# Patient Record
Sex: Male | Born: 1960 | Hispanic: No | Marital: Married | Smoking: Never smoker
Health system: Southern US, Community
[De-identification: ages and names within clinical notes are randomized; demographics above are authoritative.]

## PROBLEM LIST (undated history)

## (undated) DIAGNOSIS — I10 Essential (primary) hypertension: Secondary | ICD-10-CM

## (undated) DIAGNOSIS — E119 Type 2 diabetes mellitus without complications: Secondary | ICD-10-CM

## (undated) DIAGNOSIS — E785 Hyperlipidemia, unspecified: Secondary | ICD-10-CM

## (undated) HISTORY — DX: Hyperlipidemia, unspecified: E78.5

## (undated) HISTORY — DX: Essential (primary) hypertension: I10

## (undated) HISTORY — DX: Type 2 diabetes mellitus without complications: E11.9

## (undated) HISTORY — PX: COLONOSCOPY: SHX174

---

## 1996-02-27 HISTORY — PX: FINGER SURGERY: SHX640

## 1998-04-06 ENCOUNTER — Ambulatory Visit (HOSPITAL_BASED_OUTPATIENT_CLINIC_OR_DEPARTMENT_OTHER): Admission: RE | Admit: 1998-04-06 | Discharge: 1998-04-06 | Payer: Self-pay | Admitting: Orthopedic Surgery

## 2002-01-07 DIAGNOSIS — E113299 Type 2 diabetes mellitus with mild nonproliferative diabetic retinopathy without macular edema, unspecified eye: Secondary | ICD-10-CM

## 2003-11-25 ENCOUNTER — Ambulatory Visit: Payer: Self-pay | Admitting: Family Medicine

## 2003-12-03 ENCOUNTER — Ambulatory Visit: Payer: Self-pay | Admitting: *Deleted

## 2003-12-23 ENCOUNTER — Ambulatory Visit: Payer: Self-pay | Admitting: Internal Medicine

## 2004-03-16 ENCOUNTER — Ambulatory Visit: Payer: Self-pay | Admitting: Family Medicine

## 2004-09-06 ENCOUNTER — Ambulatory Visit: Payer: Self-pay | Admitting: Internal Medicine

## 2005-01-11 ENCOUNTER — Ambulatory Visit: Payer: Self-pay | Admitting: Internal Medicine

## 2005-08-24 ENCOUNTER — Ambulatory Visit: Payer: Self-pay | Admitting: Internal Medicine

## 2005-10-26 ENCOUNTER — Ambulatory Visit: Payer: Self-pay | Admitting: Internal Medicine

## 2006-04-05 ENCOUNTER — Ambulatory Visit: Payer: Self-pay | Admitting: Internal Medicine

## 2006-04-08 ENCOUNTER — Ambulatory Visit (HOSPITAL_COMMUNITY): Admission: RE | Admit: 2006-04-08 | Discharge: 2006-04-08 | Payer: Self-pay | Admitting: Family Medicine

## 2006-04-08 ENCOUNTER — Ambulatory Visit: Payer: Self-pay | Admitting: Internal Medicine

## 2006-07-31 ENCOUNTER — Ambulatory Visit: Payer: Self-pay | Admitting: Internal Medicine

## 2006-11-13 ENCOUNTER — Encounter (INDEPENDENT_AMBULATORY_CARE_PROVIDER_SITE_OTHER): Payer: Self-pay | Admitting: *Deleted

## 2006-12-16 ENCOUNTER — Telehealth (INDEPENDENT_AMBULATORY_CARE_PROVIDER_SITE_OTHER): Payer: Self-pay | Admitting: *Deleted

## 2007-01-08 DIAGNOSIS — I10 Essential (primary) hypertension: Secondary | ICD-10-CM

## 2007-01-09 ENCOUNTER — Ambulatory Visit: Payer: Self-pay | Admitting: Internal Medicine

## 2007-01-09 DIAGNOSIS — E7800 Pure hypercholesterolemia, unspecified: Secondary | ICD-10-CM | POA: Insufficient documentation

## 2007-01-09 DIAGNOSIS — L219 Seborrheic dermatitis, unspecified: Secondary | ICD-10-CM

## 2007-01-09 LAB — CONVERTED CEMR LAB: Hgb A1c MFr Bld: 7 %

## 2007-01-12 ENCOUNTER — Encounter (INDEPENDENT_AMBULATORY_CARE_PROVIDER_SITE_OTHER): Payer: Self-pay | Admitting: Internal Medicine

## 2007-01-12 LAB — CONVERTED CEMR LAB
ALT: 22 units/L (ref 0–53)
AST: 14 units/L (ref 0–37)
Albumin: 4.9 g/dL (ref 3.5–5.2)
Calcium: 9.6 mg/dL (ref 8.4–10.5)
Cholesterol: 155 mg/dL (ref 0–200)
HDL: 37 mg/dL — ABNORMAL LOW (ref >39)
PSA: 0.59 ng/mL (ref 0.10–4.00)
Potassium: 3.8 meq/L (ref 3.5–5.3)
Total CHOL/HDL Ratio: 4.2
Triglycerides: 179 mg/dL — ABNORMAL HIGH (ref <150)
VLDL: 36 mg/dL (ref 0–40)

## 2007-01-13 ENCOUNTER — Encounter (INDEPENDENT_AMBULATORY_CARE_PROVIDER_SITE_OTHER): Payer: Self-pay | Admitting: Internal Medicine

## 2007-06-18 ENCOUNTER — Ambulatory Visit: Payer: Self-pay | Admitting: Internal Medicine

## 2007-06-19 ENCOUNTER — Encounter (INDEPENDENT_AMBULATORY_CARE_PROVIDER_SITE_OTHER): Payer: Self-pay | Admitting: Internal Medicine

## 2007-07-08 ENCOUNTER — Telehealth (INDEPENDENT_AMBULATORY_CARE_PROVIDER_SITE_OTHER): Payer: Self-pay | Admitting: Internal Medicine

## 2007-07-14 LAB — CONVERTED CEMR LAB
ALT: 14 units/L (ref 0–53)
AST: 15 units/L (ref 0–37)
Albumin: 5 g/dL (ref 3.5–5.2)
Alkaline Phosphatase: 42 units/L (ref 39–117)
Basophils Absolute: 0 10*3/uL (ref 0.0–0.1)
Basophils Relative: 1 % (ref 0–1)
CO2: 27 meq/L (ref 19–32)
Calcium: 10 mg/dL (ref 8.4–10.5)
Chloride: 102 meq/L (ref 96–112)
Cholesterol: 155 mg/dL (ref 0–200)
Eosinophils Relative: 1 % (ref 0–5)
HCT: 45.2 % (ref 39.0–52.0)
LDL Cholesterol: 101 mg/dL — ABNORMAL HIGH (ref 0–99)
Platelets: 248 10*3/uL (ref 150–400)
RDW: 13.6 % (ref 11.5–15.5)
Sodium: 145 meq/L (ref 135–145)
Total CHOL/HDL Ratio: 3.8
Total Protein: 8.2 g/dL (ref 6.0–8.3)
Triglycerides: 63 mg/dL (ref <150)
WBC: 6.6 10*3/uL (ref 4.0–10.5)

## 2007-08-28 ENCOUNTER — Ambulatory Visit: Payer: Self-pay | Admitting: Internal Medicine

## 2007-08-28 DIAGNOSIS — M25569 Pain in unspecified knee: Secondary | ICD-10-CM

## 2007-08-28 LAB — CONVERTED CEMR LAB: Blood Glucose, Fingerstick: 139

## 2007-10-14 ENCOUNTER — Ambulatory Visit: Payer: Self-pay | Admitting: Internal Medicine

## 2007-10-14 LAB — CONVERTED CEMR LAB
ALT: 14 units/L (ref 0–53)
AST: 13 units/L (ref 0–37)
Albumin: 4.8 g/dL (ref 3.5–5.2)
Blood Glucose, Fingerstick: 114
CO2: 28 meq/L (ref 19–32)
Cholesterol: 133 mg/dL (ref 0–200)
Creatinine, Ser: 1.02 mg/dL (ref 0.40–1.50)
Glucose, Bld: 90 mg/dL (ref 70–99)
Total Bilirubin: 0.7 mg/dL (ref 0.3–1.2)
Total Protein: 7.8 g/dL (ref 6.0–8.3)
Triglycerides: 105 mg/dL (ref <150)
VLDL: 21 mg/dL (ref 0–40)

## 2007-10-17 ENCOUNTER — Ambulatory Visit: Payer: Self-pay | Admitting: Internal Medicine

## 2007-10-18 ENCOUNTER — Encounter (INDEPENDENT_AMBULATORY_CARE_PROVIDER_SITE_OTHER): Payer: Self-pay | Admitting: Internal Medicine

## 2007-10-20 ENCOUNTER — Encounter (INDEPENDENT_AMBULATORY_CARE_PROVIDER_SITE_OTHER): Payer: Self-pay | Admitting: Internal Medicine

## 2007-10-30 ENCOUNTER — Encounter (INDEPENDENT_AMBULATORY_CARE_PROVIDER_SITE_OTHER): Payer: Self-pay | Admitting: Internal Medicine

## 2008-02-05 IMAGING — CR DG CHEST 2V
2 series · 2 of 2 positions shown · non-contrast
Comparison: None.

CLINICAL DATA: Cough, fever.
 CHEST - 2 VIEW:

[view not recorded (1 of 2)]
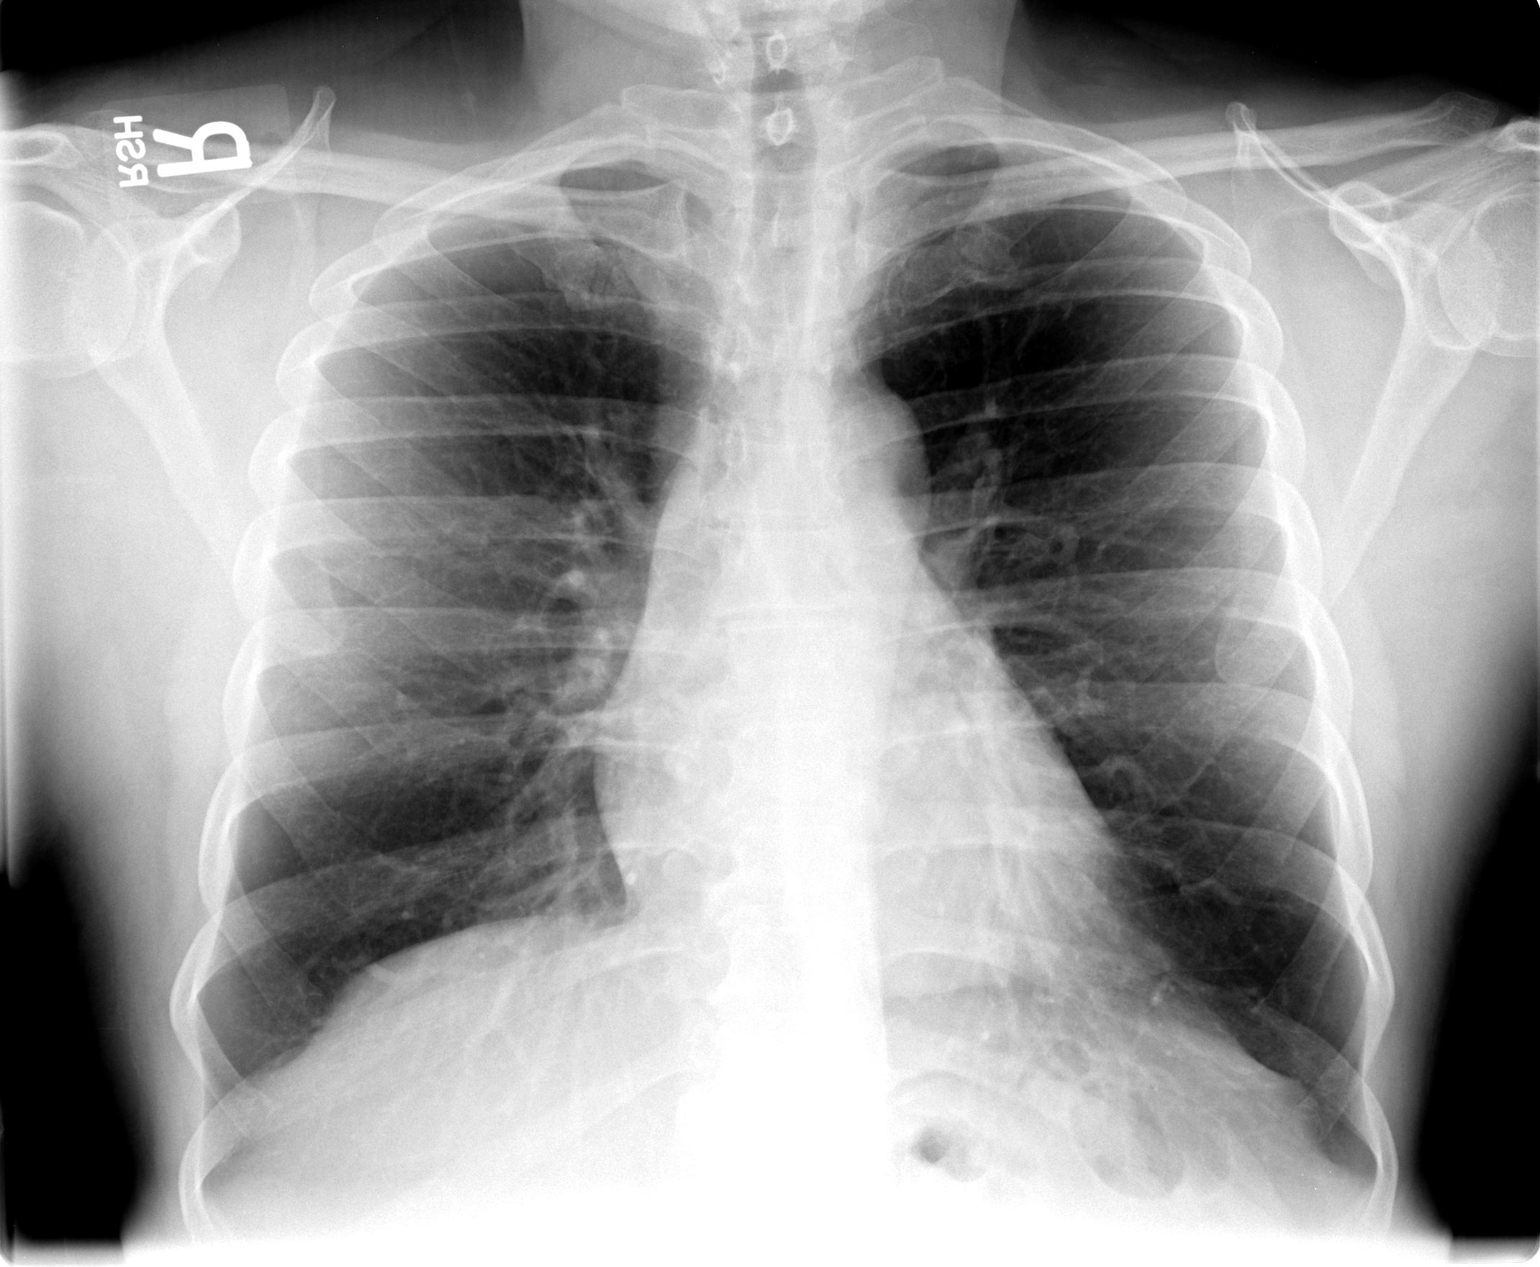

[view not recorded (2 of 2)]
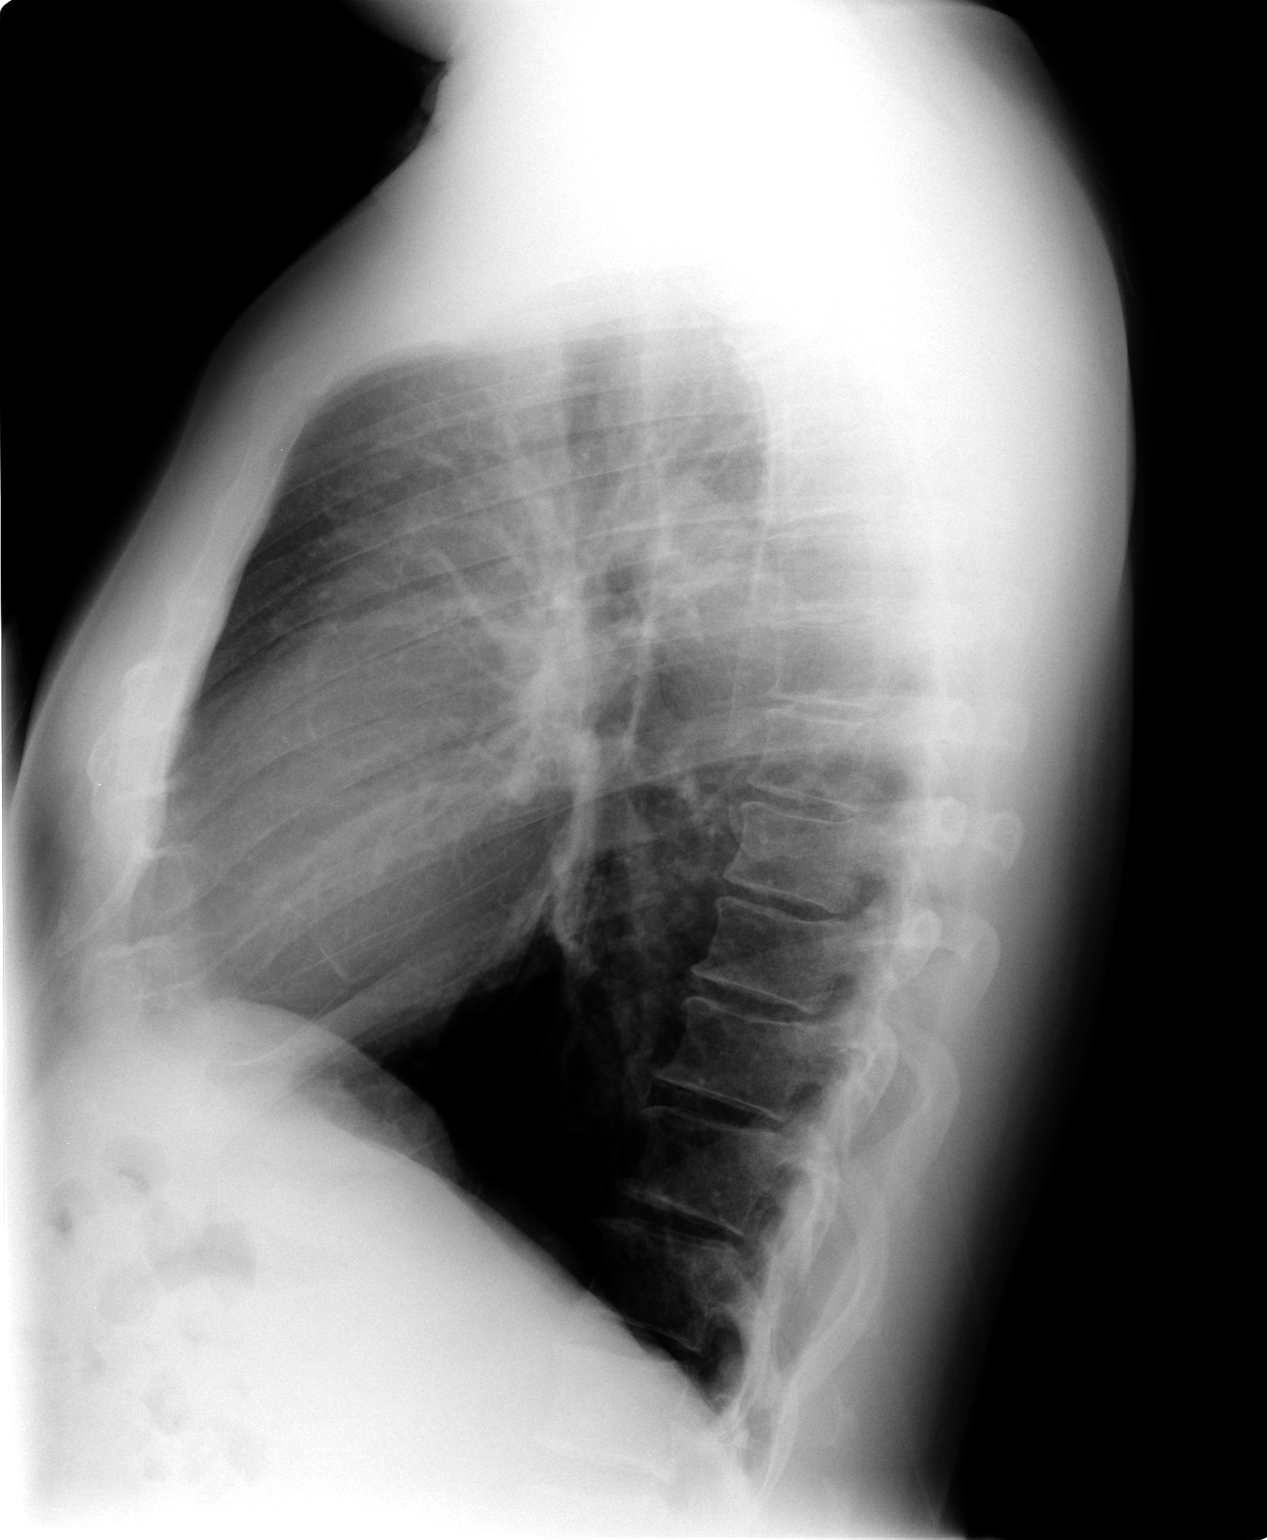

[2 of 2 positions shown; findings below may reference images not displayed]

FINDINGS: The heart size and mediastinal contours are within normal limits.  Both lungs are clear.  The visualized skeletal structures are unremarkable.
IMPRESSION: No active cardiopulmonary disease.

## 2008-04-01 ENCOUNTER — Telehealth (INDEPENDENT_AMBULATORY_CARE_PROVIDER_SITE_OTHER): Payer: Self-pay | Admitting: Internal Medicine

## 2008-05-06 ENCOUNTER — Ambulatory Visit: Payer: Self-pay | Admitting: Internal Medicine

## 2008-05-07 ENCOUNTER — Encounter (INDEPENDENT_AMBULATORY_CARE_PROVIDER_SITE_OTHER): Payer: Self-pay | Admitting: *Deleted

## 2008-05-16 LAB — CONVERTED CEMR LAB
Albumin: 4.9 g/dL (ref 3.5–5.2)
BUN: 8 mg/dL (ref 6–23)
CO2: 26 meq/L (ref 19–32)
Calcium: 9.8 mg/dL (ref 8.4–10.5)
Glucose, Bld: 90 mg/dL (ref 70–99)
Potassium: 3.7 meq/L (ref 3.5–5.3)
Sodium: 141 meq/L (ref 135–145)
Triglycerides: 113 mg/dL (ref <150)
VLDL: 23 mg/dL (ref 0–40)

## 2008-07-23 ENCOUNTER — Ambulatory Visit: Payer: Self-pay | Admitting: Internal Medicine

## 2008-07-23 LAB — CONVERTED CEMR LAB
ALT: 15 units/L (ref 0–53)
AST: 13 units/L (ref 0–37)
Albumin: 4.5 g/dL (ref 3.5–5.2)
Alkaline Phosphatase: 35 units/L — ABNORMAL LOW (ref 39–117)
Cholesterol: 116 mg/dL (ref 0–200)
HDL: 32 mg/dL — ABNORMAL LOW (ref >39)
Indirect Bilirubin: 0.5 mg/dL (ref 0.0–0.9)
Total CHOL/HDL Ratio: 3.6
Total Protein: 7.2 g/dL (ref 6.0–8.3)
Triglycerides: 87 mg/dL (ref <150)
VLDL: 17 mg/dL (ref 0–40)

## 2008-07-30 ENCOUNTER — Encounter (INDEPENDENT_AMBULATORY_CARE_PROVIDER_SITE_OTHER): Payer: Self-pay | Admitting: Internal Medicine

## 2008-09-10 ENCOUNTER — Ambulatory Visit: Payer: Self-pay | Admitting: Internal Medicine

## 2008-09-10 LAB — CONVERTED CEMR LAB
Bilirubin Urine: NEGATIVE
Blood Glucose, Fingerstick: 187
Eosinophils Absolute: 0.1 10*3/uL (ref 0.0–0.7)
HCT: 42.9 % (ref 39.0–52.0)
Ketones, urine, test strip: NEGATIVE
Lymphocytes Relative: 51 % — ABNORMAL HIGH (ref 12–46)
Monocytes Absolute: 0.3 10*3/uL (ref 0.1–1.0)
Nitrite: NEGATIVE
PSA: 0.49 ng/mL (ref 0.10–4.00)
Platelets: 231 10*3/uL (ref 150–400)
Protein, U semiquant: NEGATIVE
RBC: 4.77 M/uL (ref 4.22–5.81)
RDW: 13.5 % (ref 11.5–15.5)
Specific Gravity, Urine: 1.015
pH: 6.5

## 2008-09-25 ENCOUNTER — Encounter (INDEPENDENT_AMBULATORY_CARE_PROVIDER_SITE_OTHER): Payer: Self-pay | Admitting: Internal Medicine

## 2009-01-14 ENCOUNTER — Telehealth (INDEPENDENT_AMBULATORY_CARE_PROVIDER_SITE_OTHER): Payer: Self-pay | Admitting: Internal Medicine

## 2009-02-07 ENCOUNTER — Ambulatory Visit: Payer: Self-pay | Admitting: Internal Medicine

## 2009-02-07 LAB — CONVERTED CEMR LAB
AST: 12 units/L (ref 0–37)
Albumin: 4.7 g/dL (ref 3.5–5.2)
Bilirubin, Direct: 0.1 mg/dL (ref 0.0–0.3)
Indirect Bilirubin: 0.5 mg/dL (ref 0.0–0.9)
Total CHOL/HDL Ratio: 3.4
Total Protein: 7.4 g/dL (ref 6.0–8.3)
Triglycerides: 84 mg/dL (ref <150)

## 2009-02-23 ENCOUNTER — Ambulatory Visit: Payer: Self-pay | Admitting: Internal Medicine

## 2009-02-23 LAB — CONVERTED CEMR LAB
Blood Glucose, Fingerstick: 111
Hgb A1c MFr Bld: 6.8 % — ABNORMAL HIGH (ref 4.6–6.1)

## 2009-03-21 ENCOUNTER — Ambulatory Visit: Payer: Self-pay | Admitting: Family Medicine

## 2009-03-21 ENCOUNTER — Encounter (INDEPENDENT_AMBULATORY_CARE_PROVIDER_SITE_OTHER): Payer: Self-pay | Admitting: Internal Medicine

## 2009-03-21 DIAGNOSIS — E113299 Type 2 diabetes mellitus with mild nonproliferative diabetic retinopathy without macular edema, unspecified eye: Secondary | ICD-10-CM | POA: Insufficient documentation

## 2009-03-22 ENCOUNTER — Encounter (INDEPENDENT_AMBULATORY_CARE_PROVIDER_SITE_OTHER): Payer: Self-pay | Admitting: Internal Medicine

## 2009-03-25 ENCOUNTER — Ambulatory Visit: Payer: Self-pay | Admitting: Internal Medicine

## 2009-08-15 ENCOUNTER — Ambulatory Visit: Payer: Self-pay | Admitting: Internal Medicine

## 2009-08-15 LAB — CONVERTED CEMR LAB
AST: 18 units/L (ref 0–37)
Basophils Relative: 1 % (ref 0–1)
Bilirubin Urine: NEGATIVE
CO2: 28 meq/L (ref 19–32)
Creatinine, Ser: 0.88 mg/dL (ref 0.40–1.50)
HCT: 45.1 % (ref 39.0–52.0)
HDL: 34 mg/dL — ABNORMAL LOW (ref >39)
Hemoglobin: 14.5 g/dL (ref 13.0–17.0)
Lymphocytes Relative: 52 % — ABNORMAL HIGH (ref 12–46)
MCHC: 32.2 g/dL (ref 30.0–36.0)
MCV: 92 fL (ref 78.0–100.0)
Microalb, Ur: 0.5 mg/dL (ref 0.00–1.89)
Monocytes Absolute: 0.3 10*3/uL (ref 0.1–1.0)
Monocytes Relative: 5 % (ref 3–12)
Protein, U semiquant: NEGATIVE
RBC: 4.9 M/uL (ref 4.22–5.81)
RDW: 14 % (ref 11.5–15.5)
Sodium: 142 meq/L (ref 135–145)
Specific Gravity, Urine: <1.005
Total CHOL/HDL Ratio: 3.9
Triglycerides: 111 mg/dL (ref <150)
Urobilinogen, UA: 0.2
VLDL: 22 mg/dL (ref 0–40)
WBC Urine, dipstick: NEGATIVE
pH: 7

## 2009-08-17 ENCOUNTER — Ambulatory Visit: Payer: Self-pay | Admitting: Internal Medicine

## 2009-11-11 ENCOUNTER — Ambulatory Visit: Payer: Self-pay | Admitting: Internal Medicine

## 2009-11-22 ENCOUNTER — Ambulatory Visit: Payer: Self-pay | Admitting: Internal Medicine

## 2009-11-22 LAB — CONVERTED CEMR LAB
Blood Glucose, Fingerstick: 111
Hgb A1c MFr Bld: 6.6 %

## 2010-01-10 ENCOUNTER — Ambulatory Visit: Payer: Self-pay | Admitting: Internal Medicine

## 2010-01-10 LAB — CONVERTED CEMR LAB
AST: 18 units/L (ref 0–37)
Alkaline Phosphatase: 34 units/L — ABNORMAL LOW (ref 39–117)
BUN: 10 mg/dL (ref 6–23)
Chloride: 98 meq/L (ref 96–112)
Cholesterol: 160 mg/dL (ref 0–200)
Glucose, Bld: 95 mg/dL (ref 70–99)
HDL: 40 mg/dL (ref >39)
LDL Cholesterol: 105 mg/dL — ABNORMAL HIGH (ref 0–99)
Potassium: 3.8 meq/L (ref 3.5–5.3)
Sodium: 140 meq/L (ref 135–145)
Total Protein: 7.7 g/dL (ref 6.0–8.3)
Triglycerides: 77 mg/dL (ref <150)

## 2010-01-22 ENCOUNTER — Telehealth (INDEPENDENT_AMBULATORY_CARE_PROVIDER_SITE_OTHER): Payer: Self-pay | Admitting: Internal Medicine

## 2010-01-22 ENCOUNTER — Encounter (INDEPENDENT_AMBULATORY_CARE_PROVIDER_SITE_OTHER): Payer: Self-pay | Admitting: Internal Medicine

## 2010-02-13 ENCOUNTER — Telehealth (INDEPENDENT_AMBULATORY_CARE_PROVIDER_SITE_OTHER): Payer: Self-pay | Admitting: Internal Medicine

## 2010-03-23 ENCOUNTER — Encounter (INDEPENDENT_AMBULATORY_CARE_PROVIDER_SITE_OTHER): Payer: Self-pay | Admitting: Internal Medicine

## 2010-03-30 NOTE — Assessment & Plan Note (Signed)
Summary: CPE /tmm   Vital Signs:  Patient profile:   50 year old Warner Height:      65.5 inches Weight:      168 pounds BMI:     27.63 Temp:     98.0 degrees F oral Pulse rate:   80 / minute Pulse rhythm:   regular Resp:     18 per minute BP sitting:   133 / 92  (left arm) Cuff size:   regular  Vitals Entered By: Armenia Shannon (August 17, 2009 2:31 PM) CC: cpe CBG Result 93  Does patient need assistance? Functional Status Self care Ambulation Normal   CC:  cpe.  History of Present Illness: 50 yo Warner here for CPE  1.  Hypertension:  bp up a bit today.  Pt. states he is not missing meds.  Current Medications (verified): 1)  Hydrochlorothiazide 12.5 Mg  Tabs (Hydrochlorothiazide) .... Take 1 Tab  By Mouth Every Morning 2)  Glucophage 1000 Mg  Tabs (Metformin Hcl) .Marland Kitchen.. 1 Tab By Mouth Two Times A Day With Meals 3)  Adult Aspirin Ec Low Strength 81 Mg  Tbec (Aspirin) .Marland Kitchen.. 1 Tab By Mouth Daily 4)  Viagra 50 Mg  Tabs (Sildenafil Citrate) .Marland Kitchen.. 1 Tab Po Activity As Needed 5)  Lipitor 40 Mg  Tabs (Atorvastatin Calcium) .Marland Kitchen.. 1 Tab By Mouth Daily 6)  Lisinopril 40 Mg Tabs (Lisinopril) .Marland Kitchen.. 1 Tab By Mouth Daily 7)  Lovaza 1 Gm Caps (Omega-3-Acid Ethyl Esters) .... 4 Caps By Mouth Daily 8)  Katheren Puller Keynote W/device Kit (Blood Glucose Monitoring Suppl) .... Check Sugars Once Daily 9)  Wavesense Keynote Test  Strp (Glucose Blood) .... Once Daily Sugar Testing 10)  Wavesense Ultra-Thin Lancets  Misc (Lancets) .... Once Daily Sugar Testing  Allergies (verified): No Known Drug Allergies  Past History:  Past Medical History: Reviewed history from 09/10/2008 and no changes required. KNEE PAIN, RIGHT (ICD-719.46) HEALTH SCREENING (ICD-V70.0) ENCOUNTER FOR LONG-TERM USE OF OTHER MEDICATIONS (ICD-V58.69) HYPERCHOLESTEROLEMIA (ICD-272.0) DANDRUFF (ICD-690.18) HYPERTENSION (ICD-401.9) DIABETES MELLITUS, TYPE II (ICD-250.00)  Past Surgical History: Reviewed history from 09/10/2008 and  no changes required. None  Family History: Mother, died 62:  DM, hypertension Father, died 11:  Hit by lightning. 5 Brothers:  1 brother died in MVA 4 Sisters:  1 sister died in childbirth 3 Children:  10,13,15:  all healthy  Review of Systems General:  Energy usually good.. Eyes:  glasses.  Retasure end of 2010. ENT:  Denies decreased hearing. CV:  Denies chest pain or discomfort and palpitations. Resp:  Denies shortness of breath. GI:  Denies abdominal pain, bloody stools, constipation, dark tarry stools, and diarrhea. GU:  Denies dysuria, nocturia, and urinary frequency. MS:  Denies joint pain, joint redness, and joint swelling. Derm:  Denies lesion(s) and rash. Neuro:  Denies numbness, tingling, and weakness. Psych:  Denies anxiety, depression, and suicidal thoughts/plans; PhQ 9 scored 1.  Physical Exam  General:  Well-developed,well-nourished,in no acute distress; alert,appropriate and cooperative throughout examination Head:  Normocephalic and atraumatic without obvious abnormalities. No apparent alopecia or balding. Eyes:  No corneal or conjunctival inflammation noted. EOMI. Perrla. Funduscopic exam benign, without hemorrhages, exudates or papilledema. Vision grossly normal. Ears:  External ear exam shows no significant lesions or deformities.  Otoscopic examination reveals clear canals, tympanic membranes are intact bilaterally without bulging, retraction, inflammation or discharge. Hearing is grossly normal bilaterally. Nose:  External nasal examination shows no deformity or inflammation. Nasal mucosa are pink and moist without lesions or exudates. Mouth:  Oral mucosa and oropharynx without lesions or exudates.  Teeth in good repair--brown discoloration of left front upper incisor. Neck:  No deformities, masses, or tenderness noted. Breasts:  No masses or gynecomastia noted Lungs:  Normal respiratory effort, chest expands symmetrically. Lungs are clear to auscultation, no  crackles or wheezes. Heart:  Normal rate and regular rhythm. S1 and S2 normal without gallop, murmur, click, rub or other extra sounds. Abdomen:  Bowel sounds positive,abdomen soft and non-tender without masses, organomegaly or hernias noted. Rectal:  No external abnormalities noted. Normal sphincter tone. No rectal masses or tenderness.  Heme negative light brown stool Genitalia:  Testes bilaterally descended without nodularity, tenderness or masses. No scrotal masses or lesions. No penis lesions or urethral discharge. Prostate:  Prostate gland firm and smooth, no enlargement, nodularity, tenderness, mass, asymmetry or induration. Msk:  No deformity or scoliosis noted of thoracic or lumbar spine.   Pulses:  R and L carotid,radial,femoral,dorsalis pedis and posterior tibial pulses are full and equal bilaterally Extremities:  No clubbing, cyanosis, edema, or deformity noted with normal full range of motion of all joints.   Neurologic:  No cranial nerve deficits noted. Station and gait are normal. Plantar reflexes are down-going bilaterally. DTRs are symmetrical throughout. Sensory, motor and coordinative functions appear intact. Skin:  Intact without suspicious lesions or rashes Cervical Nodes:  No lymphadenopathy noted Axillary Nodes:  No palpable lymphadenopathy Inguinal Nodes:  No significant adenopathy Psych:  Cognition and judgment appear intact. Alert and cooperative with normal attention span and concentration. No apparent delusions, illusions, hallucinations   Impression & Recommendations:  Problem # 1:  HEALTH MAINTENANCE EXAM (ICD-V70.0) Return Hemoccult cards in 2 weeks. Meds sent to [facility] and Lipitor changed to Pravastatin as needs for 2 months If pt. prefers getting meds at [facility] pharmacy on return, can call and we will send in there. Flu shot in the fall.  Problem # 2:  DIABETES MELLITUS, TYPE II (ICD-250.00) Controlled Check on Retasure His updated medication list  for this problem includes:    Metformin Hcl 1000 Mg Tabs (Metformin hcl) .Marland Kitchen... 1 tab by mouth two times a day with meals    Adult Aspirin Ec Low Strength 81 Mg Tbec (Aspirin) .Marland Kitchen... 1 tab by mouth daily    Lisinopril 40 Mg Tabs (Lisinopril) .Marland Kitchen... 1 tab by mouth daily  Problem # 3:  HYPERCHOLESTEROLEMIA (ICD-272.0) HDL a bit low His updated medication list for this problem includes:    Pravastatin Sodium 80 Mg Tabs (Pravastatin sodium) .Marland Kitchen... 1 tab by mouth daily    Lovaza 1 Gm Caps (Omega-3-acid ethyl esters) .Marland KitchenMarland KitchenMarland KitchenMarland Kitchen 4 caps by mouth daily  Problem # 4:  HYPERTENSION (ICD-401.9) A bit high with diastolic--will have return for recheck when back from [place] His updated medication list for this problem includes:    Hydrochlorothiazide 12.5 Mg Tabs (Hydrochlorothiazide) .Marland Kitchen... Take 1 tab  by mouth every morning    Lisinopril 40 Mg Tabs (Lisinopril) .Marland Kitchen... 1 tab by mouth daily  Complete Medication List: 1)  Hydrochlorothiazide 12.5 Mg Tabs (Hydrochlorothiazide) .... Take 1 tab  by mouth every morning 2)  Metformin Hcl 1000 Mg Tabs (Metformin hcl) .Marland Kitchen.. 1 tab by mouth two times a day with meals 3)  Adult Aspirin Ec Low Strength 81 Mg Tbec (Aspirin) .Marland Kitchen.. 1 tab by mouth daily 4)  Viagra 50 Mg Tabs (Sildenafil citrate) .Marland Kitchen.. 1 tab po activity as needed 5)  Pravastatin Sodium 80 Mg Tabs (Pravastatin sodium) .Marland Kitchen.. 1 tab by mouth daily 6)  Lisinopril  40 Mg Tabs (Lisinopril) .Marland Kitchen.. 1 tab by mouth daily 7)  Lovaza 1 Gm Caps (Omega-3-acid ethyl esters) .... 4 caps by mouth daily 8)  Katheren Puller Keynote W/device Kit (Blood glucose monitoring suppl) .... Check sugars once daily 9)  Wavesense Keynote Test Strp (Glucose blood) .... Once daily sugar testing 10)  Wavesense Ultra-thin Lancets Misc (Lancets) .... Once daily sugar testing  Patient Instructions: 1)  Can get Fish oil capsules over the counter and take 2 grams twice daily to replace Lovaza 2)  Meds otherwise sent into [facility] 3)  Replace Lipitor with  Pravastatin (Pravachol) 4)  BP check with nurse visit in 2 1/2 months 5)  Follow up with Dr. Delrae Alfred in 6 months   Preventive Care Screening  Prior Values:    PSA:  0.49 (09/10/2008)    Last Tetanus Booster:  Historical (07/28/2006)    Last Flu Shot:  Fluvax 3+ (01/09/2007)    Last Pneumovax:  Historical (06/27/2003)     Guaiac Cards:  did not complete in past Colonoscopy:  never Immunizations:  states he did get a flu shot here last fall. STE:  yes--no changes.  Prescriptions: LISINOPRIL 40 MG TABS (LISINOPRIL) 1 tab by mouth daily  #30 x 11   Entered and Authorized by:   Julieanne Manson MD   Signed by:   Julieanne Manson MD on 08/17/2009   Method used:   Electronically to        [facility] [phone]* (retail)       [address]       [place], [place]  [address]       Ph: [phone]       Fax: [phone]   RxID:   [id number] METFORMIN HCL 1000 MG TABS (METFORMIN HCL) 1 tab by mouth two times a day with meals  #60 x 11   Entered and Authorized by:   Julieanne Manson MD   Signed by:   Julieanne Manson MD on 08/17/2009   Method used:   Electronically to        [facility] [phone]* (retail)       [address]       [place], [place]  [address]       Ph: [phone]       Fax: [phone]   RxID:   [id number] HYDROCHLOROTHIAZIDE 12.5 MG  TABS (HYDROCHLOROTHIAZIDE) Take 1 tab  by mouth every morning  #30 x 11   Entered and Authorized by:   Julieanne Manson MD   Signed by:   Julieanne Manson MD on 08/17/2009   Method used:   Electronically to        [facility] [phone]* (retail)       [address]       [place], [place]  [address]       Ph: [phone]       Fax: [phone]   RxID:   [id number] PRAVASTATIN SODIUM 80 MG TABS (PRAVASTATIN SODIUM) 1 tab by mouth daily  #30 x 11   Entered and Authorized by:   Julieanne Manson MD   Signed by:   Julieanne Manson MD on 08/17/2009   Method used:   Electronically to         [facility] [phone]* (retail)       [address]       [place], [place]  [address]       Ph: [phone]       Fax: [phone]  RxID:   [id number]   Appended Document: CPE /tmm  Laboratory Results   Urine Tests    Routine Urinalysis   Glucose: negative   (Normal Range: Negative) Bilirubin: negative   (Normal Range: Negative) Ketone: negative   (Normal Range: Negative) Spec. Gravity: <1.005   (Normal Range: 1.003-1.035) Blood: negative   (Normal Range: Negative) pH: 6.5   (Normal Range: 5.0-8.0) Protein: negative   (Normal Range: Negative) Urobilinogen: 0.2   (Normal Range: 0-1) Nitrite: negative   (Normal Range: Negative) Leukocyte Esterace: negative   (Normal Range: Negative)

## 2010-03-30 NOTE — Assessment & Plan Note (Signed)
Summary: DM F/U ON LABS/LR   Vital Signs:  Patient profile:   50 year old male Height:      65.5 inches Weight:      175 pounds BMI:     28.78 Temp:     97.0 degrees F oral Pulse rate:   88 / minute Pulse rhythm:   regular Resp:     18 per minute BP sitting:   130 / 90  (left arm) Cuff size:   regular  Vitals Entered By: Armenia Shannon (November 22, 2009 12:41 PM) CC: dm f/u.... Is Patient Diabetic? Yes Pain Assessment Patient in pain? no      CBG Result 111  Does patient need assistance? Functional Status Self care Ambulation Normal   CC:  dm f/u.....  History of Present Illness: Back from [place] in August.  1.  Hypertension:  taking meds regularly.  Is only taking 12.5 mg of HCTZ and Lisinopril 40 mg.  Tolerating fine.  Has gained a bit of weight.  Is exercising.  2.  DM:  A1C remains in good range.  Flu shots not available yet here--has not had elsewhere.  3.  Hyperlipidemia:  Taking Pravastatin--feels better on this than on Lipitor--has only taken for 1 month.  Not taking Lovaza as switched to fish oil capsules OTC when in [place].  Allergies: No Known Drug Allergies  Physical Exam  General:  NAD Lungs:  Normal respiratory effort, chest expands symmetrically. Lungs are clear to auscultation, no crackles or wheezes. Heart:  Normal rate and regular rhythm. S1 and S2 normal without gallop, murmur, click, rub or other extra sounds.  Radial pulses normal and equal Extremities:  No edema   Impression & Recommendations:  Problem # 1:  HYPERCHOLESTEROLEMIA (ICD-272.0) To restart Lovaza His updated medication list for this problem includes:    Pravastatin Sodium 80 Mg Tabs (Pravastatin sodium) .Marland Kitchen... 1 tab by mouth daily    Lovaza 1 Gm Caps (Omega-3-acid ethyl esters) .Marland KitchenMarland KitchenMarland KitchenMarland Kitchen 4 caps by mouth daily  Problem # 2:  HYPERTENSION (ICD-401.9) Increase HCTZ His updated medication list for this problem includes:    Hydrochlorothiazide 25 Mg Tabs (Hydrochlorothiazide)  .Marland Kitchen... 1 tab by mouth daily    Lisinopril 40 Mg Tabs (Lisinopril) .Marland Kitchen... 1 tab by mouth daily  Problem # 3:  DIABETES MELLITUS, TYPE II (ICD-250.00) Remains controlled Encouraged flu vaccine--to call next month His updated medication list for this problem includes:    Metformin Hcl 1000 Mg Tabs (Metformin hcl) .Marland Kitchen... 1 tab by mouth two times a day with meals    Adult Aspirin Ec Low Strength 81 Mg Tbec (Aspirin) .Marland Kitchen... 1 tab by mouth daily    Lisinopril 40 Mg Tabs (Lisinopril) .Marland Kitchen... 1 tab by mouth daily  Complete Medication List: 1)  Hydrochlorothiazide 25 Mg Tabs (Hydrochlorothiazide) .Marland Kitchen.. 1 tab by mouth daily 2)  Metformin Hcl 1000 Mg Tabs (Metformin hcl) .Marland Kitchen.. 1 tab by mouth two times a day with meals 3)  Adult Aspirin Ec Low Strength 81 Mg Tbec (Aspirin) .Marland Kitchen.. 1 tab by mouth daily 4)  Viagra 50 Mg Tabs (Sildenafil citrate) .Marland Kitchen.. 1 tab po activity as needed 5)  Pravastatin Sodium 80 Mg Tabs (Pravastatin sodium) .Marland Kitchen.. 1 tab by mouth daily 6)  Lisinopril 40 Mg Tabs (Lisinopril) .Marland Kitchen.. 1 tab by mouth daily 7)  Lovaza 1 Gm Caps (Omega-3-acid ethyl esters) .... 4 caps by mouth daily 8)  Katheren Puller Keynote W/device Kit (Blood glucose monitoring suppl) .... Check sugars once daily 9)  Katheren Puller Keynote Test  Strp (Glucose blood) .... Once daily sugar testing 10)  Wavesense Ultra-thin Lancets Misc (Lancets) .... Once daily sugar testing  Patient Instructions: 1)  BP check with CMET (V58.69) and FLP (272.4) in 8 weeks. 2)  Follow up with Dr. Delrae Alfred in 6 months --DM, htn Prescriptions: HYDROCHLOROTHIAZIDE 25 MG TABS (HYDROCHLOROTHIAZIDE) 1 tab by mouth daily  #30 x 11   Entered and Authorized by:   Julieanne Manson MD   Signed by:   Julieanne Manson MD on 11/22/2009   Method used:   Faxed to ...       [facility] (retail)       [address] [place], [place]  [address]       Ph: [phone]       Fax: [phone]   RxID:    [id number]   Laboratory Results   Blood Tests     HGBA1C: 6.6%   (Normal Range: Non-Diabetic - 3-6%   Control Diabetic - 6-8%) CBG Random:: 111

## 2010-03-30 NOTE — Miscellaneous (Signed)
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

## 2010-03-30 NOTE — Letter (Signed)
Summary: [facility]  [facility]   Imported By: Arta Bruce 01/25/2010 16:06:04  _____________________________________________________________________  External Attachment:    Type:   Image     Comment:   External Document

## 2010-03-30 NOTE — Progress Notes (Signed)
  Phone Note Outgoing Call   Summary of Call: Called pt.:  He is still on Pravastatin and otc fish oil capsules(switched so he could fill for [place] trip).  LDL significantly higher with last check--will go back to Lipitor and Lovaza. Initial call taken by: Julieanne Manson MD,  February 13, 2010 11:47 AM    New/Updated Medications: LIPITOR 20 MG TABS (ATORVASTATIN CALCIUM) 1 tab by mouth daily Prescriptions: LOVAZA 1 GM CAPS (OMEGA-3-ACID ETHYL ESTERS) 4 caps by mouth daily  #120 x 11   Entered and Authorized by:   Julieanne Manson MD   Signed by:   Julieanne Manson MD on 02/13/2010   Method used:   Faxed to ...       [facility] (retail)       [address] [place], [place]  [address]       Ph: [phone]       Fax: [phone]   RxID:   [id number] LIPITOR 20 MG TABS (ATORVASTATIN CALCIUM) 1 tab by mouth daily  #30 x 11   Entered and Authorized by:   Julieanne Manson MD   Signed by:   Julieanne Manson MD on 02/13/2010   Method used:   Faxed to ...       [facility] (retail)       [address] [place], [place]  [address]       Ph: [phone]       Fax: [phone]   RxID:   [id number]

## 2010-03-30 NOTE — Progress Notes (Signed)
Summary: eye referral  Phone Note Outgoing Call   Summary of Call: Ross Warner--eye referral for abnormal Retasure Initial call taken by: Ross Manson MD,  January 22, 2010 5:18 PM  Follow-up for Phone Call        SEND REFERRAL TO [facility]  11-28-11WAITING FOR AN APPT .Marland KitchenCheryll Warner  January 25, 2010 12:12 PM

## 2010-03-30 NOTE — Letter (Signed)
Summary: *HSN Results Follow up  [facility]  [address] [place], [place] [address]   Phone: [phone]  Fax: [phone]      03/22/2009   [facility] [address] [place], [place]  [address]   Dear  Mr. MARKEZ DOWLAND,                            ____S.Drinkard,FNP   ____D. Gore,FNP       ____B. McPherson,MD   ____V. Rankins,MD    ___X_E. Jilliana Burkes,MD    ____N. Daphine Deutscher, FNP  ____D. Reche Dixon, MD    ____K. Philipp Deputy, MD    ____Other     This letter is to inform you that your recent test(s):  _______Pap Smear    ___X____Lab Test     _______X-ray    ___X____ is within acceptable limits  _______ requires a medication change  _______ requires a follow-up lab visit  _______ requires a follow-up visit with your provider   Comments:  A1C shows good control of sugars:  6.8%       _________________________________________________________ If you have any questions, please contact our office                     Sincerely,  Julieanne Manson MD [facility]

## 2010-03-30 NOTE — Assessment & Plan Note (Signed)
Summary: 2 1/2 MONTH FU FOR BP////KT  Nurse Visit   Vital Signs:  Patient profile:   50 year old male Pulse rate:   76 / minute Pulse rhythm:   regular Resp:     20 per minute BP sitting:   138 / 84  (right arm) Cuff size:   regular  Vitals Entered By: Dutch Quint RN (November 11, 2009 2:25 PM)  Patient Instructions: 1)  Reviewed with Dr. Delrae Alfred 2)  Blood pressure is good -- today it was 138/84.   3)  Continue taking medications as ordered. 4)  Keep scheduled appointment with provider.  5)  Call if anything changes.   Physical Exam  General:  alert, well-developed, well-nourished, and well-hydrated.   Lungs:  normal respiratory effort.   Heart:  normal rate and regular rhythm.     Impression & Recommendations:  Problem # 1:  HYPERTENSION (ICD-401.9)  His updated medication list for this problem includes:    Hydrochlorothiazide 12.5 Mg Tabs (Hydrochlorothiazide) .Marland Kitchen... Take 1 tab  by mouth every morning    Lisinopril 40 Mg Tabs (Lisinopril) .Marland Kitchen... 1 tab by mouth daily  Complete Medication List: 1)  Hydrochlorothiazide 12.5 Mg Tabs (Hydrochlorothiazide) .... Take 1 tab  by mouth every morning 2)  Metformin Hcl 1000 Mg Tabs (Metformin hcl) .Marland Kitchen.. 1 tab by mouth two times a day with meals 3)  Adult Aspirin Ec Low Strength 81 Mg Tbec (Aspirin) .Marland Kitchen.. 1 tab by mouth daily 4)  Viagra 50 Mg Tabs (Sildenafil citrate) .Marland Kitchen.. 1 tab po activity as needed 5)  Pravastatin Sodium 80 Mg Tabs (Pravastatin sodium) .Marland Kitchen.. 1 tab by mouth daily 6)  Lisinopril 40 Mg Tabs (Lisinopril) .Marland Kitchen.. 1 tab by mouth daily 7)  Lovaza 1 Gm Caps (Omega-3-acid ethyl esters) .... 4 caps by mouth daily 8)  Katheren Puller Keynote W/device Kit (Blood glucose monitoring suppl) .... Check sugars once daily 9)  Wavesense Keynote Test Strp (Glucose blood) .... Once daily sugar testing 10)  Wavesense Ultra-thin Lancets Misc (Lancets) .... Once daily sugar testing   Review of Systems CV:  Denies chest pain or discomfort,  difficulty breathing at night, difficulty breathing while lying down, fainting, fatigue, leg cramps with exertion, lightheadness, near fainting, palpitations, shortness of breath with exertion, swelling of feet, and swelling of hands.   History of Present Illness: BP follow-up.  States he took his meds today.  Denies any adverse effects.   Allergies: No Known Drug Allergies  Orders Added: 1)  Est. Patient Level I [66440]

## 2010-03-30 NOTE — Assessment & Plan Note (Signed)
Summary: BP check /tmm  PT TOOK MED AROUND 6AM.... Nurse Visit   Vital Signs:  Patient profile:   49 year old male Pulse rate:   88 / minute Pulse rhythm:   regular BP sitting:   120 / 80  (left arm) Cuff size:   regular  Vitals Entered By: Armenia Shannon (March 25, 2009 2:28 PM)  Allergies: No Known Drug Allergies  Orders Added: 1)  Est. Patient Level I [[address]]

## 2010-03-31 NOTE — Progress Notes (Signed)
Summary: Office Visit/  DR Pollyann Kennedy  Office Visit/  DR ROSEN   Imported By: Arta Bruce 06/19/2007 14:16:02  _____________________________________________________________________  External Attachment:    Type:   Image     Comment:   External Document

## 2010-04-24 ENCOUNTER — Encounter (INDEPENDENT_AMBULATORY_CARE_PROVIDER_SITE_OTHER): Payer: Self-pay | Admitting: Internal Medicine

## 2010-05-04 NOTE — Miscellaneous (Signed)
Summary: Diabetic eye eval to flowsheet.  Clinical Lists Changes  Observations: Added new observation of DIAB EYE EX: Dr. Randon Goldsmith, [facility]:  Background diabetic retinopathy detected, but only requires monitoring.  No treatment indicated. (03/23/2010 10:05)

## 2010-05-09 NOTE — Letter (Signed)
Summary: EYE EXAM REPORT  EYE EXAM REPORT   Imported By: Arta Bruce 05/01/2010 08:59:15  _____________________________________________________________________  External Attachment:    Type:   Image     Comment:   External Document

## 2012-04-08 ENCOUNTER — Encounter: Payer: Self-pay | Admitting: Internal Medicine

## 2012-04-08 ENCOUNTER — Ambulatory Visit (INDEPENDENT_AMBULATORY_CARE_PROVIDER_SITE_OTHER): Payer: Self-pay | Admitting: Internal Medicine

## 2012-04-08 ENCOUNTER — Ambulatory Visit: Payer: Self-pay

## 2012-04-08 VITALS — BP 110/80 | HR 96 | Temp 97.8°F | Ht 66.0 in | Wt 176.0 lb

## 2012-04-08 DIAGNOSIS — E7800 Pure hypercholesterolemia, unspecified: Secondary | ICD-10-CM

## 2012-04-08 DIAGNOSIS — Z0000 Encounter for general adult medical examination without abnormal findings: Secondary | ICD-10-CM | POA: Insufficient documentation

## 2012-04-08 DIAGNOSIS — E1139 Type 2 diabetes mellitus with other diabetic ophthalmic complication: Secondary | ICD-10-CM

## 2012-04-08 DIAGNOSIS — Z79899 Other long term (current) drug therapy: Secondary | ICD-10-CM

## 2012-04-08 DIAGNOSIS — F524 Premature ejaculation: Secondary | ICD-10-CM | POA: Insufficient documentation

## 2012-04-08 DIAGNOSIS — E11319 Type 2 diabetes mellitus with unspecified diabetic retinopathy without macular edema: Secondary | ICD-10-CM

## 2012-04-08 DIAGNOSIS — E119 Type 2 diabetes mellitus without complications: Secondary | ICD-10-CM

## 2012-04-08 DIAGNOSIS — I10 Essential (primary) hypertension: Secondary | ICD-10-CM

## 2012-04-08 DIAGNOSIS — L218 Other seborrheic dermatitis: Secondary | ICD-10-CM

## 2012-04-08 LAB — BASIC METABOLIC PANEL WITH GFR
Calcium: 10.4 mg/dL (ref 8.4–10.5)
GFR, Est Non African American: >89 mL/min
Glucose, Bld: 135 mg/dL — ABNORMAL HIGH (ref 70–99)
Potassium: 3.9 mEq/L (ref 3.5–5.3)

## 2012-04-08 LAB — HEPATIC FUNCTION PANEL
AST: 27 U/L (ref 0–37)
Albumin: 5 g/dL (ref 3.5–5.2)
Alkaline Phosphatase: 35 U/L — ABNORMAL LOW (ref 39–117)
Bilirubin, Direct: 0.1 mg/dL (ref 0.0–0.3)
Total Protein: 7.8 g/dL (ref 6.0–8.3)

## 2012-04-08 LAB — LIPID PANEL: VLDL: 17 mg/dL (ref 0–40)

## 2012-04-08 LAB — GLUCOSE, CAPILLARY: Glucose-Capillary: 134 mg/dL — ABNORMAL HIGH (ref 70–99)

## 2012-04-08 LAB — POCT GLYCOSYLATED HEMOGLOBIN (HGB A1C): Hemoglobin A1C: 6.5

## 2012-04-08 MED ORDER — HYDROCHLOROTHIAZIDE 25 MG PO TABS: 25.0000 mg | ORAL_TABLET | Freq: Every day | ORAL | Status: DC

## 2012-04-08 MED ORDER — METFORMIN HCL 1000 MG PO TABS: 1000.0000 mg | ORAL_TABLET | Freq: Two times a day (BID) | ORAL | Status: DC

## 2012-04-08 MED ORDER — ATORVASTATIN CALCIUM 20 MG PO TABS: 20.0000 mg | ORAL_TABLET | Freq: Every day | ORAL | Status: DC

## 2012-04-08 MED ORDER — TERBINAFINE HCL 250 MG PO TABS: 250.0000 mg | ORAL_TABLET | Freq: Every day | ORAL | Status: DC

## 2012-04-08 MED ORDER — GLUCOSE BLOOD VI STRP: ORAL_STRIP | Status: AC

## 2012-04-08 MED ORDER — AMLODIPINE BESYLATE 5 MG PO TABS: 5.0000 mg | ORAL_TABLET | Freq: Every day | ORAL | Status: DC

## 2012-04-08 MED ORDER — ASPIRIN EC 81 MG PO TBEC: 81.0000 mg | DELAYED_RELEASE_TABLET | Freq: Every day | ORAL | Status: AC

## 2012-04-08 MED ORDER — LANCETS MISC: 1.0000 | Freq: Three times a day (TID) | Status: AC

## 2012-04-08 NOTE — Assessment & Plan Note (Signed)
This is his first visit. He reports to be taking Lipitor for the last five years.  Plan  - Continue with Lipitor 20 mg once daily  - No need to recheck it

## 2012-04-08 NOTE — Patient Instructions (Addendum)
Please continue taking your medications I will go through your records I will contact you regarding your lab tests I have sent your medications to the pharmacy you normally use

## 2012-04-08 NOTE — Assessment & Plan Note (Signed)
Lab Results  Component Value Date   HGBA1C 6.5 04/08/2012   HGBA1C 6.6 11/22/2009   HGBA1C 6.5* 08/15/2009     Assessment:  Diabetes control: good control (HgbA1C at goal)  Progress toward A1C goal:  at goal  Comments: doing well on Metformin  Plan:  Medications:  continue current medications  Home glucose monitoring:   Frequency: 3 times a day   Timing: before breakfast;before lunch;before dinner  Instruction/counseling given: reminded to get eye exam, reminded to bring blood glucose meter & log to each visit, discussed foot care and discussed the need for weight loss  Educational resources provided:    Self management tools provided: copy of home glucose meter download;instructions for home glucose monitoring;home glucose testing supplies  Other plans: filled his strip and lancets  - Microalbumin/creatinine ratio today  -We will get his record from the pathobiologist office  -foot exam done today

## 2012-04-08 NOTE — Assessment & Plan Note (Signed)
BP Readings from Last 3 Encounters:  04/08/12 110/80  01/10/10 124/80  11/22/09 130/90    Lab Results  Component Value Date   NA 140 01/10/2010   K 3.8 01/10/2010   CREATININE 0.86 01/10/2010    Assessment:  Blood pressure control: controlled  Progress toward BP goal:  at goal  Comments:doing well   Plan:  Medications:  continue current medications  Educational resources provided:    Self management tools provided: home blood pressure logbook  Other plans: patient has been encouraged to buy a BP machine and check his BP daily.

## 2012-04-08 NOTE — Assessment & Plan Note (Signed)
He denies any visual problems.  Plan  - We will get his eye exam done in Jan 2013.  -He will go back for another exam this year.

## 2012-04-08 NOTE — Assessment & Plan Note (Signed)
He is planning to see [facility] [place] and after that we can send him for screening colonoscopy. He will hopefully get a discount with orange card.

## 2012-04-08 NOTE — Assessment & Plan Note (Signed)
Plan  - started him on Terbinafine 250 mg once daily for 4 weeks

## 2012-04-08 NOTE — Progress Notes (Signed)
Patient ID: Ross Warner, male   DOB: Jan 16, 1961, 52 y.o.   MRN: [id number]  Subjective:   Patient ID: Ross Warner male   DOB: [birth year] 52 y.o.   MRN: [id number]  HPI: [name] is a 52 y.o. with past medical history of diabetes, hyperlipidemia, and hypertension presents to the clinic to establish care. He has no complaints today.  He used to go to [facility].  He reports that his compliant with his medications, including lisinopril, aspirin, HCTZ, metformin, Lipitor, and amlodipine.  Please see the A&P for the status of the pt's chronic medical problems.   No past medical history on file. Current Outpatient Prescriptions  Medication Sig Dispense Refill  . amLODipine (NORVASC) 5 MG tablet Take 1 tablet (5 mg total) by mouth daily.  30 tablet  11  . aspirin EC 81 MG tablet Take 1 tablet (81 mg total) by mouth daily.  150 tablet  3  . atorvastatin (LIPITOR) 20 MG tablet Take 1 tablet (20 mg total) by mouth daily.  30 tablet  0  . glucose blood (AGAMATRIX PRESTO TEST) test strip Use as instructed  100 each  12  . hydrochlorothiazide (HYDRODIURIL) 25 MG tablet Take 1 tablet (25 mg total) by mouth daily.  30 tablet  11  . Lancets MISC 1 each by Does not apply route 3 (three) times daily.  100 each  11  . metFORMIN (GLUCOPHAGE) 1000 MG tablet Take 1 tablet (1,000 mg total) by mouth 2 (two) times daily with a meal.  60 tablet  11  . terbinafine (LAMISIL) 250 MG tablet Take 1 tablet (250 mg total) by mouth daily.  122 tablet  0   No current facility-administered medications for this visit.   Family History  Problem Relation Age of Onset  . Hypertension Mother   . Diabetes Mother    History   Social History  . Marital Status: Married    Spouse Name: N/A    Number of Children: N/A  . 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 Smoking status: Not on file  . Smokeless tobacco:  Not on file  . Alcohol Use: No  . Drug Use: Not on file  . Sexually Active: Not on file   Other Topics Concern  . 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    Education: College level          Review of Systems: Constitutional: Denies fever, chills, diaphoresis, appetite change and fatigue.  HEENT: Denies photophobia, eye pain, redness, hearing loss, ear pain, congestion, sore throat, rhinorrhea, sneezing, mouth sores, trouble swallowing, neck pain, neck stiffness and tinnitus.   Respiratory: Denies SOB, DOE, cough, chest tightness,  and wheezing.   Cardiovascular: Denies chest pain, palpitations and leg swelling.  Gastrointestinal: Denies nausea, vomiting, abdominal pain, diarrhea, constipation, blood in stool and abdominal distention.  Genitourinary: Denies dysuria, urgency, frequency, hematuria, flank pain and difficulty urinating.  Musculoskeletal: Denies myalgias, back pain, joint swelling, arthralgias and gait problem. His previous right knee pain is better.   Skin: Denies pallor, rash and wound. He reports some rashes over his scalp for several years. Neurological: Denies dizziness, seizures, syncope, weakness, light-headedness, numbness and headaches.  Hematological: Denies adenopathy. Easy bruising,  personal or family bleeding history  Psychiatric/Behavioral: Denies suicidal ideation, mood changes, confusion, nervousness, sleep disturbance and agitation  Objective:  Physical Exam: Filed Vitals:   04/08/12 0932 04/08/12 1016  BP: 144/88 110/80  Pulse: 96   Temp: 97.8 F (36.6 C)   TempSrc: Oral   Height: 5\' 6"  (1.676 m)   Weight: 176 lb (79.833 kg)   SpO2: 100%    Constitutional: Vital signs reviewed.  Patient is a well-developed and well-nourished in no acute distress and cooperative with exam. Alert and oriented x3.  Head: Normocephalic and atraumatic Ear: TM  normal bilaterally Mouth: no erythema or exudates, MMM Eyes: PERRL, EOMI, conjunctivae normal, No scleral icterus.  Neck: Supple, Trachea midline normal ROM, No JVD, mass, thyromegaly, or carotid bruit present.  Cardiovascular: RRR, S1 normal, S2 normal, no MRG, pulses symmetric and intact bilaterally Pulmonary/Chest: CTAB, no wheezes, rales, or rhonchi Abdominal: Soft. Non-tender, non-distended, bowel sounds are normal, no masses, organomegaly, or guarding present.  GU: no CVA tenderness Musculoskeletal: No joint deformities, erythema, or stiffness, ROM full and no nontender Hematology: no cervical, inginal, or axillary adenopathy.  Neurological: A&O x3, Strength is normal and symmetric bilaterally, cranial nerve II-XII are grossly intact, no focal motor deficit, sensory intact to light touch bilaterally.  Skin: Warm, dry and intact. No rash, cyanosis, or clubbing. 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  Psychiatric: Normal mood and affect. speech and behavior is normal. Judgment and thought content normal. Cognition and memory are normal.   Assessment & Plan:  I have discussed my assessment, and plan for the care of this patient with Dr. Eben Burow. Please see my program based charting.

## 2012-04-14 ENCOUNTER — Other Ambulatory Visit: Payer: Self-pay | Admitting: Internal Medicine

## 2012-04-14 ENCOUNTER — Encounter: Payer: Self-pay | Admitting: Internal Medicine

## 2012-04-14 DIAGNOSIS — E7800 Pure hypercholesterolemia, unspecified: Secondary | ICD-10-CM

## 2012-04-14 DIAGNOSIS — N529 Male erectile dysfunction, unspecified: Secondary | ICD-10-CM

## 2012-04-14 DIAGNOSIS — E119 Type 2 diabetes mellitus without complications: Secondary | ICD-10-CM

## 2012-04-14 MED ORDER — OMEGA-3-ACID ETHYL ESTERS 1 G PO CAPS: 2.0000 g | ORAL_CAPSULE | Freq: Two times a day (BID) | ORAL | Status: DC

## 2012-04-14 MED ORDER — SILDENAFIL CITRATE 50 MG PO TABS: 50.0000 mg | ORAL_TABLET | ORAL | Status: DC | PRN

## 2012-04-14 NOTE — Progress Notes (Signed)
[facility] and updated in epic.

## 2012-04-18 ENCOUNTER — Other Ambulatory Visit: Payer: Self-pay | Admitting: *Deleted

## 2012-04-18 MED ORDER — ATORVASTATIN CALCIUM 20 MG PO TABS: 20.0000 mg | ORAL_TABLET | Freq: Every day | ORAL | Status: DC

## 2012-04-22 ENCOUNTER — Ambulatory Visit: Payer: Self-pay

## 2012-07-01 ENCOUNTER — Telehealth: Payer: Self-pay | Admitting: *Deleted

## 2012-07-01 NOTE — Telephone Encounter (Signed)
Pt called and stated Lipitor 20 mg cost $ 138 at [facility]. He has no insurance so would like to go to [facility]. I called and they have Pravastatin 40 mg (80 mg is equivalent to lipitor 20 mg)  or simvastatin  40 mg   Would you consider changing? Pt # [id number]

## 2012-07-04 MED ORDER — SIMVASTATIN 20 MG PO TABS: 20.0000 mg | ORAL_TABLET | Freq: Every evening | ORAL | Status: DC

## 2012-07-04 NOTE — Telephone Encounter (Signed)
Pt called but VM has not been set-up.

## 2012-07-04 NOTE — Telephone Encounter (Signed)
Simvastatin rx called to [facility] pharmacy.

## 2012-07-04 NOTE — Telephone Encounter (Signed)
Ross Warner, he saw Dr. Kirtland Bouchard in February and reported taking this medication for 5 years. Has he not been taking at all?

## 2012-07-04 NOTE — Telephone Encounter (Signed)
Actually, since he is on amlodipine, I will give him a reduced dose of simvastatin 20 mg nightly. Have him follow up with Dr. Kirtland Bouchard in a month. Thanks.

## 2012-07-04 NOTE — Telephone Encounter (Signed)
He stated he has been taking it but he was getting it from [facility] at a lower price and since they have closed and was taken over by [facility]. Now he cannot afford  The price at [facility].

## 2012-07-04 NOTE — Telephone Encounter (Signed)
Pt states he is out of medication and Dr Kirtland Bouchard is on vacation.  Thanks

## 2012-07-04 NOTE — Telephone Encounter (Signed)
Ok, I will send simvastatin 40 mg at night.

## 2012-07-07 ENCOUNTER — Telehealth: Payer: Self-pay | Admitting: *Deleted

## 2012-07-07 NOTE — Telephone Encounter (Signed)
Pharm calls and ask if they may give the pt pravastatin 40mg  twice daily as they do not stock simvastatin? i will call them with your approval, please change med list

## 2012-07-16 ENCOUNTER — Telehealth: Payer: Self-pay | Admitting: *Deleted

## 2012-07-16 NOTE — Telephone Encounter (Signed)
Fax from [facility] pharmacy - requesting a refill on Lisinopril 40mg ; take 1 tab daily. Last refilled 06/16/12. The pharmacy stated pt has been on this med since Nov 2013 (being filled by [facility], which has closed). Not on current med list. Pt called - states he's taking 3 BP meds - Lisinopril, Amlodipine, and HCTZ.  Thanks

## 2012-07-16 NOTE — Telephone Encounter (Signed)
He blood pressure is fairly controlled with his current medications. I would not add another medication at this time.

## 2012-07-17 ENCOUNTER — Telehealth: Payer: Self-pay | Admitting: *Deleted

## 2012-07-17 NOTE — Telephone Encounter (Signed)
[facility] pharmacy called to request refill Lisinopril 40mg  #30 - not on med list. Last filled 06/17/22 and date written 01/05/12. Pt is out of med. Stanton Kidney Jyquan Kenley RN 07/17/12  3:30PM

## 2012-07-17 NOTE — Telephone Encounter (Signed)
Pt has been taking Lisinopril since 2013.  Will you refill or have him stop this med?

## 2012-07-17 NOTE — Telephone Encounter (Signed)
Patient does not need to be on Lisinopril. The last time I saw him he was not on it and his blood pressure was good.  I have tried to call him unsuccessfully to discuss this with him. He will need to come in before I can prescribe lisinopril.

## 2012-07-18 NOTE — Telephone Encounter (Signed)
Excellent

## 2012-07-18 NOTE — Telephone Encounter (Signed)
Appt scheduled 5/28 @ 1345PM w/Dr Heloise Beecham to discuss Lisinopril.

## 2012-07-23 ENCOUNTER — Encounter: Payer: Self-pay | Admitting: Internal Medicine

## 2012-07-23 ENCOUNTER — Ambulatory Visit (INDEPENDENT_AMBULATORY_CARE_PROVIDER_SITE_OTHER): Payer: No Typology Code available for payment source | Admitting: Internal Medicine

## 2012-07-23 VITALS — BP 120/80 | HR 85 | Temp 97.8°F | Ht 66.0 in | Wt 170.1 lb

## 2012-07-23 DIAGNOSIS — E7800 Pure hypercholesterolemia, unspecified: Secondary | ICD-10-CM

## 2012-07-23 DIAGNOSIS — I10 Essential (primary) hypertension: Secondary | ICD-10-CM

## 2012-07-23 DIAGNOSIS — E119 Type 2 diabetes mellitus without complications: Secondary | ICD-10-CM

## 2012-07-23 DIAGNOSIS — Z0000 Encounter for general adult medical examination without abnormal findings: Secondary | ICD-10-CM

## 2012-07-23 DIAGNOSIS — Z1211 Encounter for screening for malignant neoplasm of colon: Secondary | ICD-10-CM

## 2012-07-23 LAB — POCT GLYCOSYLATED HEMOGLOBIN (HGB A1C): Hemoglobin A1C: 6.2

## 2012-07-23 MED ORDER — LISINOPRIL 20 MG PO TABS: 20.0000 mg | ORAL_TABLET | Freq: Every day | ORAL | Status: DC

## 2012-07-23 MED ORDER — LISINOPRIL 40 MG PO TABS: 40.0000 mg | ORAL_TABLET | Freq: Every day | ORAL | Status: DC

## 2012-07-23 MED ORDER — LISINOPRIL 20 MG PO TABS: 40.0000 mg | ORAL_TABLET | Freq: Every day | ORAL | Status: DC

## 2012-07-23 MED ORDER — PRAVASTATIN SODIUM 40 MG PO TABS: 40.0000 mg | ORAL_TABLET | Freq: Every evening | ORAL | Status: DC

## 2012-07-23 NOTE — Progress Notes (Signed)
Case discussed with Dr. Patel (at time of visit, soon after the resident saw the patient).  We reviewed the resident's history and exam and pertinent patient test results.  I agree with the assessment, diagnosis, and plan of care documented in the resident's note. 

## 2012-07-23 NOTE — Patient Instructions (Addendum)
1. For your Cholesterol: Continue taking pravastatin 40mg  daily. 2. For your Blood Pressure: STOP TAKING amlodipine. START TAKING lisinopril 1 tablet daily. Continue taking hydrochlorothiazide 3. You will be called about scheduling an appointment for colonoscopy.  GREAT JOB!!!

## 2012-07-23 NOTE — Assessment & Plan Note (Addendum)
Lab Results  Component Value Date   HGBA1C 6.2 07/23/2012   HGBA1C 6.5 04/08/2012   HGBA1C 6.6 11/22/2009     Assessment: Diabetes control: good control (HgbA1C at goal) Progress toward A1C goal:  at goal   Plan: Medications:  Continue metformin 1000 mg twice a day.  Start back ace-i (lisinopril) given elevated Microalb:cr

## 2012-07-23 NOTE — Assessment & Plan Note (Signed)
Lab Results  Component Value Date   CHOL 150 04/08/2012   HDL 40 04/08/2012   LDLCALC 93 04/08/2012   TRIG 84 04/08/2012   CHOLHDL 3.8 04/08/2012   Only statin available to him through the medication assistance program is pravastatin 40 mg. Discontinued prescription for simvastatin and refilled pravastatin

## 2012-07-23 NOTE — Progress Notes (Signed)
Patient ID: Ross Warner, male   DOB: Jul 28, 1960, 52 y.o.   MRN: [id number]  Subjective:   Patient ID: Ross Warner male   DOB: Sep 23, 1960 52 y.o.   MRN: [id number]  HPI: Mr.Omar Barstow is a 52 y.o. male with history of HTN, HLD, DM presenting for follow up of DM and HTN. He has no complaints today, only questions about his medications. [facility]. In this transition, he had concerns about his blood pressure medications. He previously been on amlodipine, hydrochlorothiazide, and lisinopril. Over the past month, he is only been taking the amlodipine and hydrochlorothiazide and was unsure if he should continue taking lisinopril. He's had good control of his blood pressure on these 2 medications. In terms of his diabetes, his blood sugars well controlled on metformin 1000 mg twice a day. Hemoglobin A1c was 6.2 today. Lab tests at last visit did reveal a Microalb:Cr ratio of 44.  In terms of his hyperlipidemia, he then transitioned from pravastatin to simvastatin; however simvastatin is unavailable at the medication assistance program formulary. The only statin available for him is pravastatin. In terms of health maintenance, he is due for colon cancer screening. At last visit, screen was deferred until he can obtain the orange card. Now he has the orange card and is interested in screening. Denies any blood in his stool or tenesmus.    Past Medical History  Diagnosis Date  . Diabetes mellitus without complication   . Hyperlipidemia   . Hypertension    Current Outpatient Prescriptions  Medication Sig Dispense Refill  . aspirin EC 81 MG tablet Take 1 tablet (81 mg total) by mouth daily.  150 tablet  3  . glucose blood (AGAMATRIX PRESTO TEST) test strip Use as instructed  100 each  12  . hydrochlorothiazide (HYDRODIURIL) 25 MG tablet Take 1 tablet (25 mg total) by mouth daily.  30 tablet  11  . Lancets MISC  1 each by Does not apply route 3 (three) times daily.  100 each  11  . lisinopril (PRINIVIL,ZESTRIL) 40 MG tablet Take 1 tablet (40 mg total) by mouth daily.  30 tablet  5  . metFORMIN (GLUCOPHAGE) 1000 MG tablet Take 1 tablet (1,000 mg total) by mouth 2 (two) times daily with a meal.  60 tablet  11  . omega-3 acid ethyl esters (LOVAZA) 1 G capsule Take 2 capsules (2 g total) by mouth 2 (two) times daily.  30 capsule  0  . pravastatin (PRAVACHOL) 40 MG tablet Take 1 tablet (40 mg total) by mouth every evening.  30 tablet  11  . sildenafil (VIAGRA) 50 MG tablet Take 1 tablet (50 mg total) by mouth as needed for erectile dysfunction.  20 tablet  1  . terbinafine (LAMISIL) 250 MG tablet Take 1 tablet (250 mg total) by mouth daily.  122 tablet  0   No current facility-administered medications for this visit.   Family History  Problem Relation Age of Onset  . Hypertension Mother   . Diabetes Mother    History   Social History  . Marital Status: Married    Spouse Name: N/A    Number of Children: N/A  . 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 Smoking status: Never Smoker   . Smokeless tobacco: None  .  Alcohol Use: No  . Drug Use: None  . Sexually Active: None   Other Topics Concern  . 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    Education: College level          Review of Systems: 10 pt ROS performed, pertinent positives and negatives noted in HPI Objective:  Physical Exam: Filed Vitals:   07/23/12 1348 07/23/12 1358  BP: 138/87 120/80  Pulse: 85 85  Temp: 97.8 F (36.6 C)   TempSrc: Oral   Height: 5\' 6"  (1.676 m)   Weight: 170 lb 1.6 oz (77.157 kg)   SpO2: 100%    Vitals reviewed. General: sitting in chair,  NAD HEENT: PERRL, EOMI, no scleral icterus Cardiac: RRR, no rubs,  murmurs or gallops Pulm: clear to auscultation bilaterally, no wheezes, rales, or rhonchi Abd: soft, nontender, nondistended, BS present Ext: warm and well perfused, no pedal edema Neuro: alert and oriented X3, cranial nerves II-XII grossly intact, strength and sensation to light touch equal in bilateral upper and lower extremities  Assessment & Plan:   Please see problem-based charting for assessment and plan.

## 2012-07-23 NOTE — Assessment & Plan Note (Signed)
Referred for screening colonoscopy today now that he has the orange card.

## 2012-07-23 NOTE — Assessment & Plan Note (Signed)
BP Readings from Last 3 Encounters:  07/23/12 120/80  04/08/12 110/80  01/10/10 124/80    Lab Results  Component Value Date   NA 142 04/08/2012   K 3.9 04/08/2012   CREATININE 0.96 04/08/2012    Assessment: Blood pressure control: controlled Progress toward BP goal:  at goal   Plan: Medications:  Patient's blood pressure is well controlled on amlodipine and hydrochlorothiazide; however with early evidence of microalbuminuria and diagnosis of diabetes, we'll substitute amlodipine for lisinopril today. He has taken lisinopril in the past and tolerated it without side effects. He is to continue taking hydrochlorothiazide. Instructed him to check his blood pressure at home.  if blood pressure is elevated, instructed that he can add back amlodipine if needed. Instructed him to call the clinic with any questions or concerns.

## 2012-09-04 ENCOUNTER — Other Ambulatory Visit: Payer: Self-pay

## 2012-10-20 ENCOUNTER — Ambulatory Visit: Payer: No Typology Code available for payment source

## 2012-10-22 ENCOUNTER — Encounter: Payer: Self-pay | Admitting: Internal Medicine

## 2012-10-22 ENCOUNTER — Ambulatory Visit (INDEPENDENT_AMBULATORY_CARE_PROVIDER_SITE_OTHER): Payer: No Typology Code available for payment source | Admitting: Internal Medicine

## 2012-10-22 VITALS — BP 120/80 | HR 70 | Temp 98.0°F | Ht 66.0 in | Wt 171.0 lb

## 2012-10-22 DIAGNOSIS — E119 Type 2 diabetes mellitus without complications: Secondary | ICD-10-CM

## 2012-10-22 DIAGNOSIS — I10 Essential (primary) hypertension: Secondary | ICD-10-CM

## 2012-10-22 DIAGNOSIS — E11319 Type 2 diabetes mellitus with unspecified diabetic retinopathy without macular edema: Secondary | ICD-10-CM

## 2012-10-22 DIAGNOSIS — Z23 Encounter for immunization: Secondary | ICD-10-CM

## 2012-10-22 DIAGNOSIS — Z0000 Encounter for general adult medical examination without abnormal findings: Secondary | ICD-10-CM

## 2012-10-22 DIAGNOSIS — E7800 Pure hypercholesterolemia, unspecified: Secondary | ICD-10-CM

## 2012-10-22 DIAGNOSIS — B350 Tinea barbae and tinea capitis: Secondary | ICD-10-CM

## 2012-10-22 LAB — POCT GLYCOSYLATED HEMOGLOBIN (HGB A1C): Hemoglobin A1C: 6.4

## 2012-10-22 MED ORDER — LISINOPRIL-HYDROCHLOROTHIAZIDE 20-12.5 MG PO TABS: 2.0000 | ORAL_TABLET | Freq: Every day | ORAL | Status: DC

## 2012-10-22 MED ORDER — OMEGA-3-ACID ETHYL ESTERS 1 G PO CAPS: 2.0000 g | ORAL_CAPSULE | Freq: Two times a day (BID) | ORAL | Status: DC

## 2012-10-22 MED ORDER — ROSUVASTATIN CALCIUM 5 MG PO TABS: 5.0000 mg | ORAL_TABLET | Freq: Every day | ORAL | Status: DC

## 2012-10-22 MED ORDER — AMLODIPINE BESYLATE 10 MG PO TABS: 5.0000 mg | ORAL_TABLET | Freq: Every day | ORAL | Status: DC

## 2012-10-22 NOTE — Assessment & Plan Note (Addendum)
Previously thought to be from a fungal infection, but it appears to be seborrheic dermatitis. No response to 3 months treatment with Terbinafine.  Plan  - will recommend OTC topical Selenium Sulfide.

## 2012-10-22 NOTE — Assessment & Plan Note (Signed)
Lab Results  Component Value Date   HGBA1C 6.4 10/22/2012   HGBA1C 6.2 07/23/2012   HGBA1C 6.5 04/08/2012     Assessment: Diabetes control: good control (HgbA1C at goal) Progress toward A1C goal:  at goal Comments: Doing well   Plan: Medications:  continue current medications Home glucose monitoring: Frequency: once a day Timing: before breakfast Instruction/counseling given: reminded to get eye exam, discussed foot care, discussed the need for weight loss and discussed diet Educational resources provided:   Self management tools provided:   Other plans: None

## 2012-10-22 NOTE — Patient Instructions (Signed)
General Instructions: Please take your medications as prescribed You are doing a good job for your diabetes and high blood pressure  I will try to see if you can be seen by a skin doctor  You will be contacted for your eye exam  I recommend a pneumonia shot today  Please come back in 2-3 month.    Treatment Goals:  Goals (1 Years of Data) as of 10/22/12         As of Today As of Today 07/23/12 07/23/12 04/08/12     Blood Pressure    . Blood Pressure < 140/90  120/80 147/88 120/80 138/87 110/80     Result Component    . HEMOGLOBIN A1C < 7.0  6.4  6.2  6.5    . LDL CALC < 130      93      Progress Toward Treatment Goals:  Treatment Goal 10/22/2012  Hemoglobin A1C at goal  Blood pressure at goal    Self Care Goals & Plans:  Self Care Goal 10/22/2012  Manage my medications take my medicines as prescribed; bring my medications to every visit; refill my medications on time  Monitor my health keep track of my blood glucose; keep track of my blood pressure  Eat healthy foods -  Be physically active -    Home Blood Glucose Monitoring 10/22/2012  Check my blood sugar once a day  When to check my blood sugar before breakfast     Care Management & Community Referrals:  Referral 10/22/2012  Referrals made for care management support none needed

## 2012-10-22 NOTE — Assessment & Plan Note (Signed)
BP Readings from Last 3 Encounters:  10/22/12 120/80  07/23/12 120/80  04/08/12 110/80    Lab Results  Component Value Date   NA 142 04/08/2012   K 3.9 04/08/2012   CREATININE 0.96 04/08/2012    Assessment: Blood pressure control: mildly elevated Progress toward BP goal:  at goal Comments: continues to take Amlodipine  Plan: Medications:  continue current medications Educational resources provided:   Self management tools provided:   Other plans: Will maintain him on all three medication  - Amlodipine 5 mg once daily. - Lisinopril-HCTZ, 25-40 mg once daily

## 2012-10-22 NOTE — Assessment & Plan Note (Signed)
Administered pneumococcal vaccine. Patient declines influenza vaccine.

## 2012-10-22 NOTE — Progress Notes (Signed)
Patient ID: Ross Warner, male   DOB: Jun 01, 1960, 52 y.o.   MRN: [id number]   Subjective:   HPI: [name] is a 52 y.o. gentleman with past medical history of diabetes, hypertension, and hyperlipidemia, presents to the clinic for routine followup visit.  Patient continues to complain of a rash in his head which did not respond to 3 months of treatment with Terbinafine. It was felt to be 2/2 tinea capitis a the time.  Patient also requests to have his medications simplified with a combination pill of lisinopril/HCTZ. He also requests to have his pravastatin, replaced by Crestor, which is provided by [facility]. Patient was advised during his last visit with Dr. Heloise Warner to stop using amlodipine, but his blood pressure reportedly was high at home and resumed using this medicationHe is otherwise compliant with his medications.  Please see the A&P for the status of the pt's chronic medical problems.   Past Medical History  Diagnosis Date  . Diabetes mellitus without complication   . Hyperlipidemia   . Hypertension    Current Outpatient Prescriptions  Medication Sig Dispense Refill  . amLODipine (NORVASC) 10 MG tablet Take 0.5 tablets (5 mg total) by mouth daily.  30 tablet  2  . aspirin EC 81 MG tablet Take 1 tablet (81 mg total) by mouth daily.  150 tablet  3  . glucose blood (AGAMATRIX PRESTO TEST) test strip Use as instructed  100 each  12  . Lancets MISC 1 each by Does not apply route 3 (three) times daily.  100 each  11  . metFORMIN (GLUCOPHAGE) 1000 MG tablet Take 1 tablet (1,000 mg total) by mouth 2 (two) times daily with a meal.  60 tablet  11  . sildenafil (VIAGRA) 50 MG tablet Take 1 tablet (50 mg total) by mouth as needed for erectile dysfunction.  20 tablet  1  . lisinopril-hydrochlorothiazide (PRINZIDE,ZESTORETIC) 20-12.5 MG per tablet Take 2 tablets by mouth daily.  120 tablet  6  . omega-3 acid ethyl esters (LOVAZA) 1 G capsule Take 2 capsules (2 g total) by mouth 2 (two)  times daily.  30 capsule  0  . rosuvastatin (CRESTOR) 5 MG tablet Take 1 tablet (5 mg total) by mouth daily.  30 tablet  2   No current facility-administered medications for this visit.   Family History  Problem Relation Age of Onset  . Hypertension Mother   . Diabetes Mother    History   Social History  . Marital Status: Married    Spouse Name: N/A    Number of Children: N/A  . 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 Smoking status: Never Smoker   . Smokeless tobacco: None  . Alcohol Use: No  . Drug Use: None  . Sexual Activity: None   Other Topics Concern  . 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    Education: College level          Review of Systems: Constitutional: Denies fever, chills, diaphoresis, appetite change and fatigue.  Respiratory: Denies SOB, DOE, cough, chest tightness, and wheezing.  Cardiovascular: No chest pain, palpitations and leg swelling.  Gastrointestinal: No abdominal pain, nausea, vomiting, bloody stools Genitourinary: No dysuria, frequency, hematuria, or flank pain.  Musculoskeletal: No myalgias, back pain, joint swelling, arthralgias    Objective:  Physical Exam: Filed Vitals:   10/22/12 1432 10/22/12 1501  BP: 147/88 120/80  Pulse: 73 70  Temp: 98 F (36.7 C)   TempSrc: Oral   Height: 5\' 6"  (1.676 m)   Weight: 171 lb (77.565 kg)   SpO2: 100%    General: Well nourished. No acute distress. HEENT: Multiple areas of skin dryness with scaling.  Lungs: CTA bilaterally. Heart: RRR; no extra sounds or murmurs  Abdomen: Non-distended, normal BS, soft, nontender; no hepatosplenomegaly  Extremities: No pedal edema. No joint swelling or tenderness. Neurologic: Alert and oriented x3. No obvious neurologic deficits.  Assessment & Plan:  I have  discussed my assessment and plan  with Dr. Dalphine Warner as detailed under problem based charting.

## 2012-10-22 NOTE — Assessment & Plan Note (Signed)
As requested, will switch the patient to Crestor 5 mg once daily. This is equivalent of 40 mg of Pravachol. Annual lipid panel

## 2012-10-23 NOTE — Progress Notes (Signed)
Case discussed with Dr.Kazibwe at the time of the visit.  We reviewed the resident's history and exam and pertinent patient test results.  I agree with the assessment, diagnosis, and plan of care documented in the resident's note.    

## 2012-10-31 ENCOUNTER — Other Ambulatory Visit: Payer: Self-pay | Admitting: *Deleted

## 2012-10-31 DIAGNOSIS — E7800 Pure hypercholesterolemia, unspecified: Secondary | ICD-10-CM

## 2012-10-31 MED ORDER — OMEGA-3-ACID ETHYL ESTERS 1 G PO CAPS: 2.0000 g | ORAL_CAPSULE | Freq: Two times a day (BID) | ORAL | Status: DC

## 2012-10-31 NOTE — Telephone Encounter (Signed)
Pt needs a 3 month supply = # 360

## 2012-11-04 MED ORDER — OMEGA-3-ACID ETHYL ESTERS 1 G PO CAPS: 2.0000 g | ORAL_CAPSULE | Freq: Two times a day (BID) | ORAL | Status: DC

## 2012-11-04 NOTE — Telephone Encounter (Signed)
Dr Zada Girt, the pharmacy requesting [phone] tabs Thanks

## 2012-11-04 NOTE — Telephone Encounter (Signed)
Called to pharm 

## 2012-11-04 NOTE — Addendum Note (Signed)
Addended by: Dow Adolph on: 11/04/2012 02:42 PM   Modules accepted: Orders

## 2012-11-13 ENCOUNTER — Telehealth: Payer: Self-pay | Admitting: *Deleted

## 2012-11-13 DIAGNOSIS — I10 Essential (primary) hypertension: Secondary | ICD-10-CM

## 2012-11-13 MED ORDER — QUINAPRIL-HYDROCHLOROTHIAZIDE 20-12.5 MG PO TABS: 1.0000 | ORAL_TABLET | Freq: Every day | ORAL | Status: DC

## 2012-11-13 NOTE — Telephone Encounter (Signed)
[facility] can supply accuretic at no cost, can you please do that to save pt money Also change pravastatin to crestor 5mg , accuretic will be free also

## 2012-11-13 NOTE — Telephone Encounter (Signed)
Spoke with the patient and made the change.  Thanks

## 2012-11-13 NOTE — Telephone Encounter (Signed)
I called the Ross Warner and informed him of the change from Zestoretoc to Accuretic . I will ask [name] to fax the new rx to the [facility].

## 2012-12-03 ENCOUNTER — Ambulatory Visit (INDEPENDENT_AMBULATORY_CARE_PROVIDER_SITE_OTHER): Payer: No Typology Code available for payment source | Admitting: Dietician

## 2012-12-03 DIAGNOSIS — E119 Type 2 diabetes mellitus without complications: Secondary | ICD-10-CM

## 2012-12-03 LAB — HM DIABETES EYE EXAM

## 2012-12-03 NOTE — Progress Notes (Signed)
Retinal photo taken and transmitted.

## 2012-12-08 NOTE — Addendum Note (Signed)
Addended by: Bufford Spikes on: 12/08/2012 12:24 PM   Modules accepted: Orders

## 2013-01-28 ENCOUNTER — Other Ambulatory Visit: Payer: Self-pay | Admitting: *Deleted

## 2013-01-28 DIAGNOSIS — E7800 Pure hypercholesterolemia, unspecified: Secondary | ICD-10-CM

## 2013-01-28 MED ORDER — OMEGA-3-ACID ETHYL ESTERS 1 G PO CAPS: 2.0000 g | ORAL_CAPSULE | Freq: Two times a day (BID) | ORAL | Status: DC

## 2013-01-30 NOTE — Telephone Encounter (Signed)
Called to pharm 

## 2013-04-01 ENCOUNTER — Ambulatory Visit (INDEPENDENT_AMBULATORY_CARE_PROVIDER_SITE_OTHER): Payer: No Typology Code available for payment source | Admitting: Internal Medicine

## 2013-04-01 ENCOUNTER — Encounter: Payer: Self-pay | Admitting: Internal Medicine

## 2013-04-01 VITALS — BP 147/85 | HR 80 | Temp 98.8°F | Wt 174.8 lb

## 2013-04-01 DIAGNOSIS — L219 Seborrheic dermatitis, unspecified: Secondary | ICD-10-CM

## 2013-04-01 DIAGNOSIS — E7800 Pure hypercholesterolemia, unspecified: Secondary | ICD-10-CM

## 2013-04-01 DIAGNOSIS — E11319 Type 2 diabetes mellitus with unspecified diabetic retinopathy without macular edema: Secondary | ICD-10-CM

## 2013-04-01 DIAGNOSIS — E119 Type 2 diabetes mellitus without complications: Secondary | ICD-10-CM

## 2013-04-01 DIAGNOSIS — Z0000 Encounter for general adult medical examination without abnormal findings: Secondary | ICD-10-CM

## 2013-04-01 DIAGNOSIS — I10 Essential (primary) hypertension: Secondary | ICD-10-CM

## 2013-04-01 LAB — GLUCOSE, CAPILLARY: Glucose-Capillary: 102 mg/dL — ABNORMAL HIGH (ref 70–99)

## 2013-04-01 LAB — POCT GLYCOSYLATED HEMOGLOBIN (HGB A1C): Hemoglobin A1C: 6.6

## 2013-04-01 NOTE — Patient Instructions (Signed)
General Instructions: It is always nice to see you!  I will call you with results from you labs     Treatment Goals:  Goals (1 Years of Data) as of 04/01/13         As of Today 10/22/12 10/22/12 07/23/12 07/23/12     Blood Pressure    . Blood Pressure < 140/90  147/85 120/80 147/88 120/80 138/87     Result Component    . HEMOGLOBIN A1C < 7.0  6.6 6.4  6.2     . LDL CALC < 130            Progress Toward Treatment Goals:  Treatment Goal 04/01/2013  Hemoglobin A1C at goal  Blood pressure at goal    Self Care Goals & Plans:  Self Care Goal 04/01/2013  Manage my medications take my medicines as prescribed; bring my medications to every visit; refill my medications on time; follow the sick day instructions if I am sick  Monitor my health -  Eat healthy foods -  Be physically active -    Home Blood Glucose Monitoring 04/01/2013  Check my blood sugar no home glucose monitoring  When to check my blood sugar -     Care Management & Community Referrals:  Referral 10/22/2012  Referrals made for care management support none needed

## 2013-04-02 LAB — MICROALBUMIN / CREATININE URINE RATIO
CREATININE, URINE: 217.6 mg/dL
MICROALB/CREAT RATIO: 7 mg/g (ref 0.0–30.0)
Microalb, Ur: 1.52 mg/dL (ref 0.00–1.89)

## 2013-04-02 LAB — LIPID PANEL
CHOLESTEROL: 197 mg/dL (ref 0–200)
HDL: 46 mg/dL (ref >39)
LDL Cholesterol: 130 mg/dL — ABNORMAL HIGH (ref 0–99)
TRIGLYCERIDES: 104 mg/dL (ref <150)
Total CHOL/HDL Ratio: 4.3 Ratio
VLDL: 21 mg/dL (ref 0–40)

## 2013-04-02 LAB — VITAMIN D 25 HYDROXY (VIT D DEFICIENCY, FRACTURES): VIT D 25 HYDROXY: 74 ng/mL (ref 30–89)

## 2013-04-02 LAB — VITAMIN B12: Vitamin B-12: 997 pg/mL — ABNORMAL HIGH (ref 211–911)

## 2013-04-02 NOTE — Progress Notes (Signed)
Patient ID: Ross Warner, male   DOB: [birth year], 53 y.o.   MRN: [id number]   Subjective:   HPI: [name] is a 53 y.o. gentleman with past medical history of diabetes, hypertension, and hyperlipidemia, presents to the clinic for routine followup visit.  The patient has no complaints today. He wants me to up-to-date his medications.   Please see the A&P for the status of the pt's chronic medical problems.   Past Medical History  Diagnosis Date  . Diabetes mellitus without complication   . Hyperlipidemia   . Hypertension    Current Outpatient Prescriptions  Medication Sig Dispense Refill  . amLODipine (NORVASC) 10 MG tablet Take 0.5 tablets (5 mg total) by mouth daily.  30 tablet  2  . aspirin EC 81 MG tablet Take 1 tablet (81 mg total) by mouth daily.  150 tablet  3  . glucose blood (AGAMATRIX PRESTO TEST) test strip Use as instructed  100 each  12  . Lancets MISC 1 each by Does not apply route 3 (three) times daily.  100 each  11  . metFORMIN (GLUCOPHAGE) 1000 MG tablet Take 1 tablet (1,000 mg total) by mouth 2 (two) times daily with a meal.  60 tablet  11  . omega-3 acid ethyl esters (LOVAZA) 1 G capsule Take 2 capsules (2 g total) by mouth 2 (two) times daily.  360 capsule  4  . quinapril-hydrochlorothiazide (ACCURETIC) 20-12.5 MG per tablet Take 1 tablet by mouth daily.  90 tablet  3  . rosuvastatin (CRESTOR) 5 MG tablet Take 1 tablet (5 mg total) by mouth daily.  30 tablet  2  . sildenafil (VIAGRA) 50 MG tablet Take 1 tablet (50 mg total) by mouth as needed for erectile dysfunction.  20 tablet  1   No current facility-administered medications for this visit.   Family History  Problem Relation Age of Onset  . Hypertension Mother   . Diabetes Mother    History   Social History  . Marital Status: Married    Spouse Name: N/A    Number of Children: N/A  . 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 Smoking status: Never Smoker   . Smokeless tobacco: None  . Alcohol Use: No  . Drug Use: None  . Sexual Activity: None   Other Topics Concern  . 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    Education: College level          Review of Systems: Constitutional: Denies fever, chills, diaphoresis, appetite change and fatigue.  Respiratory: Denies SOB, DOE, cough, chest tightness, and wheezing.  Cardiovascular: No chest pain, palpitations and leg swelling.  Gastrointestinal: No abdominal pain, nausea, vomiting, bloody stools Genitourinary: No dysuria, frequency, hematuria, or flank pain.  Musculoskeletal: No myalgias, back pain, joint swelling, arthralgias    Objective:  Physical Exam: Filed Vitals:   04/01/13 1556  BP: 147/85  Pulse: 80  Temp: 98.8 F (37.1 C)  TempSrc: Oral  Weight: 174 lb 12.8 oz (79.289 kg)  SpO2: 100%   General: Well nourished. No acute distress. HEENT: Multiple areas of skin dryness with scaling.  Lungs: CTA bilaterally. Heart: RRR; no extra sounds or murmurs  Abdomen: Non-distended, normal BS, soft, nontender;  no hepatosplenomegaly  Extremities: No pedal edema. No joint swelling or tenderness. Neurologic: Alert and oriented x3. No obvious neurologic deficits.  Assessment & Plan:  I have discussed my assessment and plan  with Dr. Lynnae January as detailed under problem based charting.

## 2013-04-02 NOTE — Assessment & Plan Note (Signed)
BP Readings from Last 3 Encounters:  04/01/13 147/85  10/22/12 120/80  07/23/12 120/80    Lab Results  Component Value Date   NA 142 04/08/2012   K 3.9 04/08/2012   CREATININE 0.96 04/08/2012    Assessment: Blood pressure control: controlled Progress toward BP goal:  at goal Comments: well controlled  Plan: Medications:  continue current medications Educational resources provided:   Self management tools provided:   Other plans: follow up in 7 months

## 2013-04-02 NOTE — Assessment & Plan Note (Signed)
Check Lipid panel today. Cont with crestor.

## 2013-04-02 NOTE — Assessment & Plan Note (Signed)
Lab Results  Component Value Date   HGBA1C 6.6 04/01/2013   HGBA1C 6.4 10/22/2012   HGBA1C 6.2 07/23/2012     Assessment: Diabetes control: good control (HgbA1C at goal) Progress toward A1C goal:  at goal Comments: compliant with medications  Plan: Medications:  continue with Metformin 1g bid Home glucose monitoring: Frequency: no home glucose monitoring Timing:   Instruction/counseling given: discussed foot care and discussed diet Educational resources provided:   Self management tools provided:   Other plans: foot exam done today. Will also order urine microalbumin/creatinine

## 2013-04-06 NOTE — Progress Notes (Signed)
Case discussed with Dr. Kazibwe soon after the resident saw the patient.  We reviewed the resident's history and exam and pertinent patient test results.  I agree with the assessment, diagnosis, and plan of care documented in the resident's note. 

## 2013-04-17 ENCOUNTER — Ambulatory Visit: Payer: No Typology Code available for payment source

## 2013-04-17 ENCOUNTER — Other Ambulatory Visit: Payer: Self-pay | Admitting: *Deleted

## 2013-04-17 DIAGNOSIS — E119 Type 2 diabetes mellitus without complications: Secondary | ICD-10-CM

## 2013-04-17 MED ORDER — METFORMIN HCL 1000 MG PO TABS: 1000.0000 mg | ORAL_TABLET | Freq: Two times a day (BID) | ORAL | Status: DC

## 2013-04-22 ENCOUNTER — Ambulatory Visit (INDEPENDENT_AMBULATORY_CARE_PROVIDER_SITE_OTHER): Payer: No Typology Code available for payment source | Admitting: Internal Medicine

## 2013-04-22 ENCOUNTER — Encounter: Payer: Self-pay | Admitting: Internal Medicine

## 2013-04-22 VITALS — BP 140/80 | HR 91 | Temp 96.6°F | Ht 66.0 in | Wt 174.7 lb

## 2013-04-22 DIAGNOSIS — R21 Rash and other nonspecific skin eruption: Secondary | ICD-10-CM

## 2013-04-22 DIAGNOSIS — I10 Essential (primary) hypertension: Secondary | ICD-10-CM

## 2013-04-22 DIAGNOSIS — E119 Type 2 diabetes mellitus without complications: Secondary | ICD-10-CM

## 2013-04-22 DIAGNOSIS — E785 Hyperlipidemia, unspecified: Secondary | ICD-10-CM

## 2013-04-22 DIAGNOSIS — E7800 Pure hypercholesterolemia, unspecified: Secondary | ICD-10-CM

## 2013-04-22 LAB — GLUCOSE, CAPILLARY: Glucose-Capillary: 127 mg/dL — ABNORMAL HIGH (ref 70–99)

## 2013-04-22 MED ORDER — ROSUVASTATIN CALCIUM 10 MG PO TABS: 10.0000 mg | ORAL_TABLET | Freq: Every day | ORAL | Status: DC

## 2013-04-22 NOTE — Patient Instructions (Signed)
1. Please increase your Crestor dose to 10 mg daily from now.  2. Please take all medications as prescribed.  3. If you have worsening of your symptoms or new symptoms arise, please call the clinic ([phone]), or go to the ER immediately if symptoms are severe.  You have done great job in taking all your medications. I appreciate it very much. Please continue doing that.  Please bring in all your medication bottles with you in next visit.

## 2013-04-22 NOTE — Assessment & Plan Note (Signed)
His 10 year risk for CVD is 11%. LDL was 130 on 04/01/13. Will increase his crestor dose to 10 mg daily.  His LFT was normal on 04/08/12.

## 2013-04-22 NOTE — Progress Notes (Signed)
Patient ID: Ross Warner, male   DOB: 1960-10-21, 53 y.o.   MRN: [id number] Subjective:   Patient ID: Ross Warner male   DOB: 25-Mar-1960 53 y.o.   MRN: [id number]  CC:   Follow up visit.        HPI:  [name] is a 53 y.o. man with past medical history as outlined below, who presents for a followup visit today  1.  HTN: patient is currently taking amlodipine 10 mg daily, Accuretic 20/12.5 mg daily. Blood pressure was 151/59 when he came in to clinic, but the repeated the blood pressure measurement is 140/80. He does not have chest pain, shortness of breath or leg edema.  2. DM-II: A1c was 6.6 on 04/01/13. Patient is currently taking metformin 1000 mg twice a day. He does not have symptoms for hyper-or hypoglycemia.  3. HLD: LDL was 130 mg daily. Currently isn't taking Crestor 5 mg daily. His liver function was normal on 04/08/12.  4. Skin rash: patient noticed a single rash over left mid abdominal wall on 04/17/13. It was initially red in color, then became brown, then light pink in color today. He denies itches, tender or burning, fever or chill. Actually, he is asymptomatic. He reports that the rash size become smaller and the color become lighter.   ROS:  Denies fever, chills, fatigue, headaches, cough, chest pain, SOB,  abdominal pain, diarrhea, constipation, dysuria, urgency, frequency, hematuria, joint pain or leg swelling.    Past Medical History  Diagnosis Date  . Diabetes mellitus without complication   . Hyperlipidemia   . Hypertension    Current Outpatient Prescriptions  Medication Sig Dispense Refill  . amLODipine (NORVASC) 10 MG tablet Take 0.5 tablets (5 mg total) by mouth daily.  30 tablet  2  . glucose blood (AGAMATRIX PRESTO TEST) test strip Use as instructed  100 each  12  . Lancets MISC 1 each by Does not apply route 3 (three) times daily.  100 each  11  . metFORMIN (GLUCOPHAGE) 1000 MG tablet Take 1 tablet (1,000 mg total) by mouth 2 (two) times daily  with a meal.  60 tablet  11  . omega-3 acid ethyl esters (LOVAZA) 1 G capsule Take 2 capsules (2 g total) by mouth 2 (two) times daily.  360 capsule  4  . quinapril-hydrochlorothiazide (ACCURETIC) 20-12.5 MG per tablet Take 1 tablet by mouth daily.  90 tablet  3  . rosuvastatin (CRESTOR) 10 MG tablet Take 1 tablet (10 mg total) by mouth daily.  30 tablet  2  . sildenafil (VIAGRA) 50 MG tablet Take 1 tablet (50 mg total) by mouth as needed for erectile dysfunction.  20 tablet  1   No current facility-administered medications for this visit.   Family History  Problem Relation Age of Onset  . Hypertension Mother   . Diabetes Mother    History   Social History  . Marital Status: Married    Spouse Name: N/A    Number of Children: N/A  . 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 Smoking status: Never Smoker   . Smokeless tobacco: None  . Alcohol Use: No  . Drug Use: None  . Sexual Activity: None   Other Topics Concern  . 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    Education: College level           Review of Systems: Full 14-point review of systems otherwise negative. See HPI.   Objective:  Physical Exam: Filed Vitals:   04/22/13 0941 04/22/13 1006  BP: 151/59 140/80  Pulse: 91   Temp: 96.6 F (35.9 C)   TempSrc: Oral   Height: 5\' 6"  (1.676 m)   Weight: 174 lb 11.2 oz (79.243 kg)   SpO2: 100%    Constitutional: Vital signs reviewed.  Patient is a well-developed and well-nourished, in no acute distress and cooperative with exam.   HEENT:  Head: Normocephalic and atraumatic Mouth: no erythema or exudates, MMM Eyes: PERRL, EOMI, conjunctivae normal, No scleral icterus.  Neck: Supple, Trachea midline normal ROM, No JVD  Cardiovascular: RRR, S1 normal, S2 normal, no MRG, pulses  symmetric and intact bilaterally Pulmonary/Chest: CTAB, no wheezes, rales, or rhonchi Abdominal: Soft. Non-tender, non-distended, bowel sounds are normal, no masses, organomegaly, or guarding present.  GU: no CVA tenderness Musculoskeletal: No joint deformities, erythema, or stiffness, ROM full and non-tender Extremities: No leg edema Hematology: no cervical, inginal, or axillary adenopathy.  Neurological: A&O x3, Strength is normal and symmetric bilaterally, cranial nerve II-XII are grossly intact, no focal motor deficit, sensory intact to light touch bilaterally.  Skin: there is a single rash over the left mid abdomen, no elevation from skin, no ulcer, light pink in color, no blister, nontender, not itchy. 2 X 3 cm in size. Psychiatric: Normal mood and affect. No suicidal or homicidal ideation.  Assessment & Plan:

## 2013-04-22 NOTE — Assessment & Plan Note (Signed)
Etiology is not clear, but does not seem to be bacterial infection. Currently he is asymptomatic. He reports that the rash size become smaller and the color become lighter, indicating resolving. Will continue to observe.

## 2013-04-22 NOTE — Assessment & Plan Note (Signed)
BP Readings from Last 3 Encounters:  04/22/13 140/80  04/01/13 147/85  10/22/12 120/80    Lab Results  Component Value Date   NA 142 04/08/2012   K 3.9 04/08/2012   CREATININE 0.96 04/08/2012    Assessment: Blood pressure control: controlled Progress toward BP goal:  at goal Comments:   Plan: Medications:  continue current medications Educational resources provided: brochure Self management tools provided:   Other plans: blood pressure is controlled. Will continue current.

## 2013-04-22 NOTE — Assessment & Plan Note (Signed)
Lab Results  Component Value Date   HGBA1C 6.6 04/01/2013   HGBA1C 6.4 10/22/2012   HGBA1C 6.2 07/23/2012     Assessment: Diabetes control: good control (HgbA1C at goal) Progress toward A1C goal:  at goal Comments:   Plan: Medications:  continue current medications Home glucose monitoring: Frequency:   Timing:   Instruction/counseling given: discussed foot care Educational resources provided: brochure Self management tools provided:   Other plans: will continue current regimen, metformin d the milligrams twice a day.

## 2013-04-28 NOTE — Progress Notes (Signed)
INTERNAL MEDICINE TEACHING ATTENDING ADDENDUM - Dominic Pea, DO: I reviewed and discussed at the time of visit with the resident Dr. Blaine Hamper, the patient's medical history, physical examination, diagnosis and results of tests and treatment and I agree with the patient's care as documented.

## 2013-04-29 ENCOUNTER — Encounter: Payer: Self-pay | Admitting: Internal Medicine

## 2013-04-29 DIAGNOSIS — R195 Other fecal abnormalities: Secondary | ICD-10-CM

## 2013-04-29 DIAGNOSIS — K635 Polyp of colon: Secondary | ICD-10-CM | POA: Insufficient documentation

## 2013-04-29 NOTE — Progress Notes (Signed)
Patient ID: Ross Warner, male   DOB: [birth year], 53 y.o.   MRN: [id number] Noted results from the [facility] 04/17/2013 positive X3.  Patient has been referred to GI in 06/2012 but has not been seen  His orange card needs to be updates as it is most recently expired.   Plan  - [name] will assist him get his card renewed  - Talked with the patient over the phone and emphasized the need to have a colonoscopy ASAP pending orange card updated.  - will cont to follow.

## 2013-05-01 ENCOUNTER — Encounter: Payer: Self-pay | Admitting: Gastroenterology

## 2013-05-04 LAB — HEMOCCULT SLIDES (X 3 CARDS)

## 2013-05-06 ENCOUNTER — Ambulatory Visit (AMBULATORY_SURGERY_CENTER): Payer: Self-pay | Admitting: *Deleted

## 2013-05-06 VITALS — Ht 66.0 in | Wt 175.4 lb

## 2013-05-06 DIAGNOSIS — K921 Melena: Secondary | ICD-10-CM

## 2013-05-06 NOTE — Progress Notes (Signed)
No allergies to eggs or soy. No problems with anesthesia.  

## 2013-05-15 ENCOUNTER — Other Ambulatory Visit: Payer: Self-pay | Admitting: Internal Medicine

## 2013-05-15 ENCOUNTER — Encounter: Payer: Self-pay | Admitting: Gastroenterology

## 2013-05-15 ENCOUNTER — Ambulatory Visit (AMBULATORY_SURGERY_CENTER): Payer: Self-pay | Admitting: Gastroenterology

## 2013-05-15 VITALS — BP 124/78 | HR 72 | Temp 97.7°F | Resp 13 | Ht 66.0 in | Wt 175.0 lb

## 2013-05-15 DIAGNOSIS — D126 Benign neoplasm of colon, unspecified: Secondary | ICD-10-CM

## 2013-05-15 DIAGNOSIS — K921 Melena: Secondary | ICD-10-CM

## 2013-05-15 LAB — GLUCOSE, CAPILLARY
Glucose-Capillary: 121 mg/dL — ABNORMAL HIGH (ref 70–99)
Glucose-Capillary: 118 mg/dL — ABNORMAL HIGH (ref 70–99)

## 2013-05-15 MED ORDER — SODIUM CHLORIDE 0.9 % IV SOLN
500.0000 mL | INTRAVENOUS | Status: DC
Start: 2013-05-15 — End: 2013-05-24

## 2013-05-15 NOTE — Patient Instructions (Signed)
YOU HAD AN ENDOSCOPIC PROCEDURE TODAY AT THE [facility]: Refer to the procedure report that was given to you for any specific questions about what was found during the examination.  If the procedure report does not answer your questions, please call your gastroenterologist to clarify.  If you requested that your care partner not be given the details of your procedure findings, then the procedure report has been included in a sealed envelope for you to review at your convenience later.  YOU SHOULD EXPECT: Some feelings of bloating in the abdomen. Passage of more gas than usual.  Walking can help get rid of the air that was put into your GI tract during the procedure and reduce the bloating. If you had a lower endoscopy (such as a colonoscopy or flexible sigmoidoscopy) you may notice spotting of blood in your stool or on the toilet paper. If you underwent a bowel prep for your procedure, then you may not have a normal bowel movement for a few days.  DIET: Your first meal following the procedure should be a light meal and then it is ok to progress to your normal diet.  A half-sandwich or bowl of soup is an example of a good first meal.  Heavy or fried foods are harder to digest and may make you feel nauseous or bloated.  Likewise meals heavy in dairy and vegetables can cause extra gas to form and this can also increase the bloating.  Drink plenty of fluids but you should avoid alcoholic beverages for 24 hours.  ACTIVITY: Your care partner should take you home directly after the procedure.  You should plan to take it easy, moving slowly for the rest of the day.  You can resume normal activity the day after the procedure however you should NOT DRIVE or use heavy machinery for 24 hours (because of the sedation medicines used during the test).    SYMPTOMS TO REPORT IMMEDIATELY: A gastroenterologist can be reached at any hour.  During normal business hours, 8:30 AM to 5:00 PM Monday through Friday,  call [phone].  After hours and on weekends, please call the GI answering service at [phone] who will take a message and have the physician on call contact you.   Following lower endoscopy (colonoscopy or flexible sigmoidoscopy):  Excessive amounts of blood in the stool  Significant tenderness or worsening of abdominal pains  Swelling of the abdomen that is new, acute  Fever of 100F or higher  FOLLOW UP: If any biopsies were taken you will be contacted by phone or by letter within the next 1-3 weeks.  Call your gastroenterologist if you have not heard about the biopsies in 3 weeks.  Our staff will call the home number listed on your records the next business day following your procedure to check on you and address any questions or concerns that you may have at that time regarding the information given to you following your procedure. This is a courtesy call and so if there is no answer at the home number and we have not heard from you through the emergency physician on call, we will assume that you have returned to your regular daily activities without incident.  SIGNATURES/CONFIDENTIALITY: You and/or your care partner have signed paperwork which will be entered into your electronic medical record.  These signatures attest to the fact that that the information above on your After Visit Summary has been reviewed and is understood.  Full responsibility of the confidentiality of this   discharge information lies with you and/or your care-partner.  Recommendations Next colonoscopy in 1 year

## 2013-05-15 NOTE — Progress Notes (Signed)
Report to pacu rn, vss, bbs=clear 

## 2013-05-15 NOTE — Progress Notes (Signed)
Called to room to assist during endoscopic procedure.  Patient ID and intended procedure confirmed with present staff. Received instructions for my participation in the procedure from the performing physician.  

## 2013-05-15 NOTE — Op Note (Signed)
[facility]  [address] [place], [id number]   COLONOSCOPY PROCEDURE REPORT  PATIENT: [name], [name]  MR#: [id number] BIRTHDATE: May 07, 1960 , 53  yrs. old GENDER: Male ENDOSCOPIST: Inda Castle, MD REFERRED BY: PROCEDURE DATE:  05/15/2013 PROCEDURE:   Colonoscopy with snare polypectomy and Submucosal injection, any substance First Screening Colonoscopy - Avg.  risk and is 50 yrs.  old or older Yes.  Prior Negative Screening - Now for repeat screening. N/A  History of Adenoma - Now for follow-up colonoscopy & has been > or = to 3 yrs.  N/A  Polyps Removed Today? Yes. ASA CLASS:   Class II INDICATIONS:occult blood . MEDICATIONS: MAC sedation, administered by CRNA and propofol (Diprivan) 300mg  IV  DESCRIPTION OF PROCEDURE:   After the risks benefits and alternatives of the procedure were thoroughly explained, informed consent was obtained.  A digital rectal exam revealed no abnormalities of the rectum.   The LB [id number]  endoscope was introduced through the anus and advanced to the cecum, which was identified by both the appendix and ileocecal valve. No adverse events experienced.   The quality of the prep was Suprep good  The instrument was then slowly withdrawn as the colon was fully examined.      COLON FINDINGS: A pedunculated polyp measuring 3 cm in size was found in the descending colon.  A polypectomy was performed using snare cautery.  The resection was complete and the polyp tissue was completely retrieved.  A tattoo was applied.   The colon was otherwise normal.  There was no diverticulosis, inflammation, polyps or cancers unless previously stated.  Retroflexed views revealed no abnormalities. The time to cecum=3 minutes 27 seconds. Withdrawal time=13 minutes 27 seconds.  The scope was withdrawn and the procedure completed. COMPLICATIONS: There were no complications.  ENDOSCOPIC IMPRESSION: 1.   Pedunculated polyp measuring 3 cm  in size was found in the descending colon; polypectomy was performed using snare cautery; a tattoo was applied 2.   The colon was otherwise normal  RECOMMENDATIONS: 1.  colonoscopy one year    eSigned:  Inda Castle, MD 05/15/2013 9:44 AM   cc: Jessee Avers, MD   PATIENT NAME:  Ross Warner, Ross Warner MR#: [id number]

## 2013-05-18 ENCOUNTER — Other Ambulatory Visit: Payer: Self-pay | Admitting: *Deleted

## 2013-05-18 ENCOUNTER — Telehealth: Payer: Self-pay | Admitting: *Deleted

## 2013-05-18 DIAGNOSIS — I10 Essential (primary) hypertension: Secondary | ICD-10-CM

## 2013-05-18 NOTE — Telephone Encounter (Signed)
  Follow up Call-  Call back number 05/15/2013  Post procedure Call Back phone  # [phone]  Permission to leave phone message Yes     Patient questions:  Do you have a fever, pain , or abdominal swelling? no Pain Score  0 *  Have you tolerated food without any problems? yes  Have you been able to return to your normal activities? yes  Do you have any questions about your discharge instructions: Diet   no Medications  no Follow up visit  no  Do you have questions or concerns about your Care? no  Actions: * If pain score is 4 or above: No action needed, pain <4.

## 2013-05-19 ENCOUNTER — Other Ambulatory Visit: Payer: Self-pay | Admitting: *Deleted

## 2013-05-19 MED ORDER — AMLODIPINE BESYLATE 10 MG PO TABS: 5.0000 mg | ORAL_TABLET | Freq: Every day | ORAL | Status: DC

## 2013-05-19 NOTE — Telephone Encounter (Signed)
review 

## 2013-05-19 NOTE — Telephone Encounter (Signed)
Called to pharm 

## 2013-05-25 ENCOUNTER — Encounter: Payer: Self-pay | Admitting: Gastroenterology

## 2013-06-15 ENCOUNTER — Other Ambulatory Visit: Payer: Self-pay | Admitting: *Deleted

## 2013-06-15 DIAGNOSIS — E7800 Pure hypercholesterolemia, unspecified: Secondary | ICD-10-CM

## 2013-06-15 MED ORDER — ROSUVASTATIN CALCIUM 10 MG PO TABS: 10.0000 mg | ORAL_TABLET | Freq: Every day | ORAL | Status: DC

## 2013-06-16 NOTE — Telephone Encounter (Signed)
Rx called in to pharmacy. 

## 2013-09-09 ENCOUNTER — Encounter: Payer: Self-pay | Admitting: Internal Medicine

## 2013-09-09 ENCOUNTER — Ambulatory Visit (INDEPENDENT_AMBULATORY_CARE_PROVIDER_SITE_OTHER): Payer: No Typology Code available for payment source | Admitting: Internal Medicine

## 2013-09-09 VITALS — BP 149/89 | HR 76 | Temp 98.4°F | Ht 66.0 in | Wt 173.1 lb

## 2013-09-09 DIAGNOSIS — R195 Other fecal abnormalities: Secondary | ICD-10-CM

## 2013-09-09 DIAGNOSIS — D126 Benign neoplasm of colon, unspecified: Secondary | ICD-10-CM

## 2013-09-09 DIAGNOSIS — E119 Type 2 diabetes mellitus without complications: Secondary | ICD-10-CM

## 2013-09-09 DIAGNOSIS — I10 Essential (primary) hypertension: Secondary | ICD-10-CM

## 2013-09-09 LAB — BASIC METABOLIC PANEL WITH GFR
BUN: 8 mg/dL (ref 6–23)
CHLORIDE: 99 meq/L (ref 96–112)
CO2: 32 mEq/L (ref 19–32)
CREATININE: 0.87 mg/dL (ref 0.50–1.35)
Calcium: 9.9 mg/dL (ref 8.4–10.5)
GFR, Est African American: >89 mL/min
Glucose, Bld: 134 mg/dL — ABNORMAL HIGH (ref 70–99)
POTASSIUM: 3.8 meq/L (ref 3.5–5.3)
Sodium: 140 mEq/L (ref 135–145)

## 2013-09-09 LAB — POCT GLYCOSYLATED HEMOGLOBIN (HGB A1C): HEMOGLOBIN A1C: 7

## 2013-09-09 LAB — GLUCOSE, CAPILLARY: Glucose-Capillary: 141 mg/dL — ABNORMAL HIGH (ref 70–99)

## 2013-09-09 MED ORDER — AMLODIPINE BESYLATE 10 MG PO TABS: 10.0000 mg | ORAL_TABLET | Freq: Every day | ORAL | Status: DC

## 2013-09-09 NOTE — Patient Instructions (Signed)
General Instructions: Please increase your amlodipine to 10 mg daily  Please take your other medications as before We will check your kidney function today  Please come back in 3  Months      Treatment Goals:  Goals (1 Years of Data) as of 09/09/13         As of Today 05/15/13 05/15/13 05/15/13 05/15/13     Blood Pressure    . Blood Pressure < 140/90  149/89 124/78 124/82 116/73 106/71     Result Component    . HEMOGLOBIN A1C < 7.0  7.0        . LDL CALC < 130            Progress Toward Treatment Goals:  Treatment Goal 09/09/2013  Hemoglobin A1C at goal  Blood pressure at goal    Self Care Goals & Plans:  Self Care Goal 09/09/2013  Manage my medications take my medicines as prescribed; bring my medications to every visit; refill my medications on time  Monitor my health keep track of my blood glucose; bring my glucose meter and log to each visit  Eat healthy foods eat more vegetables; eat foods that are low in salt; eat baked foods instead of fried foods  Be physically active take a walk every day    Home Blood Glucose Monitoring 09/09/2013  Check my blood sugar once a day  When to check my blood sugar before breakfast     Care Management & Community Referrals:  Referral 09/09/2013  Referrals made for care management support none needed

## 2013-09-09 NOTE — Progress Notes (Signed)
Patient ID: Ross Warner, male   DOB: [birth year], 53 y.o.   MRN: [id number]   Subjective:   HPI: [name] is a 53 y.o. gentleman with past medical history of diabetes, hypertension, and hyperlipidemia, presents to the clinic for routine followup visit.   He had a colonoscopy performed on 05/15/2013 which revealed a 3 cm polyp. Biopsy was performed and pathology revealed - tubular adenoma without high-grade dysplasia. He is scheduled for repeat colonoscopy in 3 years. He denies any black stools or rectal bleeding. He will need a repeat colonoscopy in 3 years.  The patient has no complaints today.   Please see the A&P for the status of the pt's chronic medical problems.   Past Medical History  Diagnosis Date  . Diabetes mellitus without complication   . Hyperlipidemia   . Hypertension    Current Outpatient Prescriptions  Medication Sig Dispense Refill  . amLODipine (NORVASC) 10 MG tablet Take 1 tablet (10 mg total) by mouth daily.  90 tablet  3  . aspirin 81 MG tablet Take 81 mg by mouth daily.      Marland Kitchen glucose blood (AGAMATRIX PRESTO TEST) test strip Use as instructed  100 each  12  . Lancets MISC 1 each by Does not apply route 3 (three) times daily.  100 each  11  . metFORMIN (GLUCOPHAGE) 1000 MG tablet Take 1 tablet (1,000 mg total) by mouth 2 (two) times daily with a meal.  60 tablet  11  . omega-3 acid ethyl esters (LOVAZA) 1 G capsule Take 2 capsules (2 g total) by mouth 2 (two) times daily.  360 capsule  4  . quinapril-hydrochlorothiazide (ACCURETIC) 20-12.5 MG per tablet Take 1 tablet by mouth daily.  90 tablet  3  . rosuvastatin (CRESTOR) 10 MG tablet Take 1 tablet (10 mg total) by mouth daily.  30 tablet  2  . sildenafil (VIAGRA) 50 MG tablet Take 1 tablet (50 mg total) by mouth as needed for erectile dysfunction.  20 tablet  1   No current facility-administered medications for this visit.   Family History  Problem Relation Age of Onset  . Hypertension Mother     . Diabetes Mother   . Colon cancer Neg Hx    History   Social History  . Marital Status: Married    Spouse Name: N/A    Number of Children: N/A  . 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 Smoking status: Never Smoker   . Smokeless tobacco: Never Used  . Alcohol Use: No  . Drug Use: No  . Sexual Activity: None   Other Topics Concern  . 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    Education: College level          Review of Systems: Constitutional: Denies fever, chills, diaphoresis, appetite change and fatigue.  Respiratory: Denies SOB, DOE, cough, chest tightness, and wheezing.  Cardiovascular: No chest pain, palpitations and leg swelling.  Gastrointestinal: No abdominal pain, nausea, vomiting, bloody stools Genitourinary: No dysuria, frequency, hematuria, or flank pain.  Musculoskeletal: No myalgias, back pain, joint swelling, arthralgias    Objective:  Physical Exam: Filed Vitals:   09/09/13 1516  BP: 149/89  Pulse: 76  Temp: 98.4 F (  36.9 C)  TempSrc: Oral  Height: 5\' 6"  (1.676 m)  Weight: 173 lb 1.6 oz (78.518 kg)  SpO2: 100%   a repeat blood pressure 150/80. General: Well nourished. No acute distress. HEENT: Mucous membranes are moist. Lungs: CTA bilaterally. Heart: RRR; no extra sounds or murmurs  Abdomen: Non-distended, normal BS, soft, nontender; no hepatosplenomegaly  Extremities: No pedal edema. No joint swelling or tenderness. Neurologic: Alert and oriented x3. No obvious neurologic deficits.  Assessment & Plan:  I have discussed my assessment and plan  with Dr. Dareen Piano as detailed under problem based charting.

## 2013-09-09 NOTE — Assessment & Plan Note (Signed)
A followup colonoscopy in 3 years per GI

## 2013-09-09 NOTE — Assessment & Plan Note (Signed)
Lab Results  Component Value Date   HGBA1C 7.0 09/09/2013   HGBA1C 6.6 04/01/2013   HGBA1C 6.4 10/22/2012     Assessment: Diabetes control: good control (HgbA1C at goal) Progress toward A1C goal:  at goal Comments: he is fasting right now.  Plan: Medications:  cont with Metformin 1000 mg bid.  Home glucose monitoring: Frequency: once a day Timing: before breakfast Instruction/counseling given: discussed the need for weight loss Educational resources provided: handout Self management tools provided:   Other plans: will follow up in 3 months.

## 2013-09-09 NOTE — Assessment & Plan Note (Signed)
BP Readings from Last 3 Encounters:  09/09/13 149/89  05/15/13 124/78  04/22/13 140/80    Lab Results  Component Value Date   NA 142 04/08/2012   K 3.9 04/08/2012   CREATININE 0.96 04/08/2012    Assessment: Blood pressure control: controlled Progress toward BP goal:  at goal Comments: Slightly elevated with repeat showing 150/80 mmHg  Plan: Medications:  Increase amlodipine from 5 mg daily to 10 mg daily. Continue with Accuretic 20-12.5 mg daily.  Educational resources provided:   Self management tools provided:   Other plans: check BMET today  Follow up in 3 months.

## 2013-09-10 NOTE — Progress Notes (Signed)
INTERNAL MEDICINE TEACHING ATTENDING ADDENDUM - Aldine Contes, MD: I reviewed and discussed at the time of visit with the resident Dr. Alice Rieger, the patient's medical history, physical examination, diagnosis and results of tests and treatment and I agree with the patient's care as documented.

## 2013-11-05 ENCOUNTER — Ambulatory Visit: Payer: Self-pay

## 2013-11-24 ENCOUNTER — Other Ambulatory Visit: Payer: Self-pay | Admitting: *Deleted

## 2013-11-24 ENCOUNTER — Other Ambulatory Visit: Payer: Self-pay | Admitting: Internal Medicine

## 2013-11-24 DIAGNOSIS — E7800 Pure hypercholesterolemia, unspecified: Secondary | ICD-10-CM

## 2013-11-24 MED ORDER — ROSUVASTATIN CALCIUM 10 MG PO TABS: 10.0000 mg | ORAL_TABLET | Freq: Every day | ORAL | Status: DC

## 2013-11-24 NOTE — Telephone Encounter (Signed)
Called to pharmacy, dr Alice Rieger you ordered #60, 1 a day, i gave the health dept #90 w/ 3 additional refills, is this ok? Will you change the med list to reflect this?

## 2013-12-02 ENCOUNTER — Telehealth: Payer: Self-pay | Admitting: *Deleted

## 2013-12-02 ENCOUNTER — Encounter: Payer: Self-pay | Admitting: *Deleted

## 2013-12-02 DIAGNOSIS — I10 Essential (primary) hypertension: Secondary | ICD-10-CM

## 2013-12-02 MED ORDER — QUINAPRIL-HYDROCHLOROTHIAZIDE 20-12.5 MG PO TABS: 1.0000 | ORAL_TABLET | Freq: Every day | ORAL | Status: DC

## 2013-12-02 NOTE — Telephone Encounter (Signed)
Pharmacy is requesting refills on Accuretic 20/12.5mg  #90 at [facility]. Hilda Blades Yuritzy Zehring RN 12/02/13 2:15PM

## 2013-12-08 NOTE — Telephone Encounter (Signed)
Rx called in to pharmacy. Hilda Blades Hilario Robarts RN 12/08/13 4PM

## 2013-12-21 LAB — HM DIABETES EYE EXAM

## 2013-12-22 ENCOUNTER — Encounter: Payer: Self-pay | Admitting: Dietician

## 2013-12-23 ENCOUNTER — Encounter: Payer: No Typology Code available for payment source | Admitting: Internal Medicine

## 2013-12-23 ENCOUNTER — Ambulatory Visit (INDEPENDENT_AMBULATORY_CARE_PROVIDER_SITE_OTHER): Payer: Self-pay | Admitting: Dietician

## 2013-12-23 DIAGNOSIS — E119 Type 2 diabetes mellitus without complications: Secondary | ICD-10-CM

## 2013-12-23 NOTE — Progress Notes (Signed)
Retinal images done today and transmitted.

## 2013-12-28 ENCOUNTER — Encounter: Payer: Self-pay | Admitting: *Deleted

## 2014-01-20 ENCOUNTER — Ambulatory Visit (INDEPENDENT_AMBULATORY_CARE_PROVIDER_SITE_OTHER): Payer: Self-pay | Admitting: Internal Medicine

## 2014-01-20 ENCOUNTER — Encounter: Payer: Self-pay | Admitting: Internal Medicine

## 2014-01-20 VITALS — BP 130/72 | HR 80 | Temp 97.9°F | Ht 66.0 in | Wt 163.9 lb

## 2014-01-20 DIAGNOSIS — Z0000 Encounter for general adult medical examination without abnormal findings: Secondary | ICD-10-CM

## 2014-01-20 DIAGNOSIS — E11319 Type 2 diabetes mellitus with unspecified diabetic retinopathy without macular edema: Secondary | ICD-10-CM

## 2014-01-20 DIAGNOSIS — E113299 Type 2 diabetes mellitus with mild nonproliferative diabetic retinopathy without macular edema, unspecified eye: Secondary | ICD-10-CM

## 2014-01-20 DIAGNOSIS — I10 Essential (primary) hypertension: Secondary | ICD-10-CM

## 2014-01-20 DIAGNOSIS — E119 Type 2 diabetes mellitus without complications: Secondary | ICD-10-CM

## 2014-01-20 DIAGNOSIS — K635 Polyp of colon: Secondary | ICD-10-CM

## 2014-01-20 DIAGNOSIS — D126 Benign neoplasm of colon, unspecified: Secondary | ICD-10-CM

## 2014-01-20 LAB — POCT GLYCOSYLATED HEMOGLOBIN (HGB A1C): Hemoglobin A1C: 6.4

## 2014-01-20 LAB — GLUCOSE, CAPILLARY: Glucose-Capillary: 101 mg/dL — ABNORMAL HIGH (ref 70–99)

## 2014-01-20 NOTE — Assessment & Plan Note (Signed)
BP Readings from Last 3 Encounters:  01/20/14 130/72  09/09/13 149/89  05/15/13 124/78    Lab Results  Component Value Date   NA 140 09/09/2013   K 3.8 09/09/2013   CREATININE 0.87 09/09/2013    Assessment: Blood pressure control: controlled Progress toward BP goal:  at goal Comments: bp is at goal today.  Plan: Medications:  Continue with amlodipine10 mg daily. Continue with Accuretic 20-12.5 mg daily.  Educational resources provided:   Self management tools provided:   Other plans: Follow up in 3 months.

## 2014-01-20 NOTE — Progress Notes (Signed)
Patient ID: Ross Warner, male   DOB: 02-Jan-1961, 53 y.o.   MRN: [id number]   Subjective:   HPI: [name] is a 53 y.o.  gentleman with past medical history of diabetes, hypertension, and hyperlipidemia, presents to the clinic for routine followup visit. No complaints today. Adherent with medications. Does not need any refills today.  Kindly see the A&P for the status of the pt's chronic medical problems.  ROS: Constitutional: Denies fever, chills, diaphoresis, appetite change and fatigue.  Respiratory: Denies SOB, DOE, cough, chest tightness, and wheezing. Denies chest pain. CVS: No chest pain, palpitations and leg swelling.  GI: No abdominal pain, nausea, vomiting, bloody stools GU: No dysuria, frequency, hematuria, or flank pain.  MSK: No myalgias, back pain, joint swelling, arthralgias  Psych: No depression symptoms. No SI or SA.    Objective:  Physical Exam: Filed Vitals:   01/20/14 1335  BP: 130/72  Pulse: 80  Temp: 97.9 F (36.6 C)  TempSrc: Oral  Height: 5\' 6"  (1.676 m)  Weight: 163 lb 14.4 oz (74.345 kg)  SpO2: 100%   General: Well nourished. No acute distress.  HEENT: Normal oral mucosa. MMM.  Lungs: CTA bilaterally. Heart: RRR; no extra sounds or murmurs  Abdomen: Non-distended, normal bowel sounds, soft, nontender; no hepatosplenomegaly  Extremities: No pedal edema. No joint swelling or tenderness. Neurologic: Normal EOM,  Alert and oriented x3. No obvious neurologic/cranial nerve deficits.  Assessment & Plan:  Discussed case with my attending in the clinic, Dr. Daryll Drown See problem based charting.

## 2014-01-20 NOTE — Patient Instructions (Signed)
General Instructions: Please continue with your medications   Please bring your medicines with you each time you come to clinic.  Medicines may include prescription medications, over-the-counter medications, herbal remedies, eye drops, vitamins, or other pills.   Progress Toward Treatment Goals:  Treatment Goal 01/20/2014  Hemoglobin A1C at goal  Blood pressure at goal    Self Care Goals & Plans:  Self Care Goal 01/20/2014  Manage my medications take my medicines as prescribed; bring my medications to every visit; refill my medications on time  Monitor my health -  Eat healthy foods -  Be physically active -    Home Blood Glucose Monitoring 01/20/2014  Check my blood sugar once a day  When to check my blood sugar before breakfast     Care Management & Community Referrals:  Referral 01/20/2014  Referrals made for care management support none needed

## 2014-01-20 NOTE — Assessment & Plan Note (Signed)
Will follow up in 12 months

## 2014-01-20 NOTE — Assessment & Plan Note (Signed)
Lab Results  Component Value Date   HGBA1C 6.4 01/20/2014   HGBA1C 7.0 09/09/2013   HGBA1C 6.6 04/01/2013     Assessment: Diabetes control: good control (HgbA1C at goal) Progress toward A1C goal:  at goal Comments: taking his medications.   Plan: Medications:  cont with Metformin 1000 mg bid.  Home glucose monitoring: Frequency: once a day Timing: before breakfast Instruction/counseling given: discussed the need for weight loss Educational resources provided:   Self management tools provided: copy of home glucose meter download Other plans: will follow up in 3 months.

## 2014-01-22 NOTE — Progress Notes (Signed)
Internal Medicine Clinic Attending  Case discussed with Dr. Kazibwe soon after the resident saw the patient.  We reviewed the resident's history and exam and pertinent patient test results.  I agree with the assessment, diagnosis, and plan of care documented in the resident's note. 

## 2014-04-15 ENCOUNTER — Other Ambulatory Visit: Payer: Self-pay | Admitting: *Deleted

## 2014-04-15 DIAGNOSIS — E7800 Pure hypercholesterolemia, unspecified: Secondary | ICD-10-CM

## 2014-04-15 MED ORDER — OMEGA-3-ACID ETHYL ESTERS 1 G PO CAPS: 2.0000 g | ORAL_CAPSULE | Freq: Two times a day (BID) | ORAL | Status: AC

## 2014-04-19 NOTE — Telephone Encounter (Signed)
done

## 2014-04-30 ENCOUNTER — Ambulatory Visit (INDEPENDENT_AMBULATORY_CARE_PROVIDER_SITE_OTHER): Payer: Self-pay | Admitting: Internal Medicine

## 2014-04-30 ENCOUNTER — Encounter: Payer: Self-pay | Admitting: Internal Medicine

## 2014-04-30 VITALS — BP 136/80 | HR 82 | Temp 97.7°F | Ht 66.0 in | Wt 165.2 lb

## 2014-04-30 DIAGNOSIS — E113299 Type 2 diabetes mellitus with mild nonproliferative diabetic retinopathy without macular edema, unspecified eye: Secondary | ICD-10-CM

## 2014-04-30 DIAGNOSIS — E7800 Pure hypercholesterolemia, unspecified: Secondary | ICD-10-CM

## 2014-04-30 DIAGNOSIS — E119 Type 2 diabetes mellitus without complications: Secondary | ICD-10-CM

## 2014-04-30 DIAGNOSIS — I10 Essential (primary) hypertension: Secondary | ICD-10-CM

## 2014-04-30 LAB — LIPID PANEL
Cholesterol: 126 mg/dL (ref 0–200)
HDL: 46 mg/dL (ref >=40)
LDL Cholesterol: 67 mg/dL (ref 0–99)
Total CHOL/HDL Ratio: 2.7 Ratio
Triglycerides: 64 mg/dL (ref <150)
VLDL: 13 mg/dL (ref 0–40)

## 2014-04-30 LAB — GLUCOSE, CAPILLARY: Glucose-Capillary: 125 mg/dL — ABNORMAL HIGH (ref 70–99)

## 2014-04-30 LAB — POCT GLYCOSYLATED HEMOGLOBIN (HGB A1C): Hemoglobin A1C: 6.7

## 2014-04-30 MED ORDER — AMLODIPINE BESYLATE 10 MG PO TABS: 10.0000 mg | ORAL_TABLET | Freq: Every day | ORAL | Status: DC

## 2014-04-30 MED ORDER — QUINAPRIL-HYDROCHLOROTHIAZIDE 20-12.5 MG PO TABS
1.0000 | ORAL_TABLET | Freq: Every day | ORAL | Status: DC
Start: 2014-04-30 — End: 2014-09-03

## 2014-04-30 MED ORDER — ROSUVASTATIN CALCIUM 10 MG PO TABS: 10.0000 mg | ORAL_TABLET | Freq: Every day | ORAL | Status: DC

## 2014-04-30 NOTE — Progress Notes (Signed)
   Subjective:    Patient ID: Ross Warner, male    DOB: [birth year], 54 y.o.   MRN: [id number]  HPI  Mr Ross Warner is a 54 year old man with DM2, HTN, HL who presents for routine follow-up. Please see problem-based charting assessment and plan note for further details of medical issues addressed at today's visit.   Review of Systems  Constitutional: Negative for fever, chills and diaphoresis.  Respiratory: Negative for cough and shortness of breath.   Cardiovascular: Negative for chest pain.  Gastrointestinal: Negative for nausea, vomiting, abdominal pain, diarrhea and constipation.  Neurological: Negative for weakness, light-headedness, numbness and headaches.       Objective:   Physical Exam  Constitutional: He is oriented to person, place, and time. He appears well-developed and well-nourished. No distress.  HENT:  Head: Normocephalic and atraumatic.  Mouth/Throat: Oropharynx is clear and moist.  Eyes: EOM are normal. Pupils are equal, round, and reactive to light.  Cardiovascular: Normal rate, regular rhythm, normal heart sounds and intact distal pulses.  Exam reveals no gallop and no friction rub.   No murmur heard. Pulmonary/Chest: Effort normal and breath sounds normal. No respiratory distress. He has no wheezes.  Abdominal: Soft. Bowel sounds are normal. He exhibits no distension. There is no tenderness.  Musculoskeletal: He exhibits no edema.  Neurological: He is alert and oriented to person, place, and time.  Skin: He is not diaphoretic.  Vitals reviewed.         Assessment & Plan:

## 2014-04-30 NOTE — Assessment & Plan Note (Signed)
Ross Warner's last lipid profile was 04/01/13 with total cholesterol 197, LDL 130, HDL 46, triglyceride 104. He is on rosuvastatin 10 mg daily. -check lipid panel -continue rosuvastatin 10 mg daily

## 2014-04-30 NOTE — Patient Instructions (Signed)
It was a pleasure to see you today. We made no changes to your medicines. Please return to clinic or seek medical attention if you have any new or worsening high or low blood sugar, trouble breathing, chest pain, or other worrisome medical condition. We look forward to seeing you again in 3 months.  Lottie Mussel, MD  General Instructions:   Thank you for bringing your medicines today. This helps [place] keep you safe from mistakes.   Progress Toward Treatment Goals:  Treatment Goal 04/30/2014  Hemoglobin A1C at goal  Blood pressure at goal    Self Care Goals & Plans:  Self Care Goal 04/30/2014  Manage my medications take my medicines as prescribed; bring my medications to every visit; refill my medications on time  Monitor my health -  Eat healthy foods drink diet soda or water instead of juice or soda; eat more vegetables; eat foods that are low in salt; eat baked foods instead of fried foods; eat fruit for snacks and desserts  Be physically active -    Home Blood Glucose Monitoring 01/20/2014  Check my blood sugar once a day  When to check my blood sugar before breakfast     Care Management & Community Referrals:  Referral 01/20/2014  Referrals made for care management support none needed

## 2014-04-30 NOTE — Assessment & Plan Note (Signed)
Lab Results  Component Value Date   HGBA1C 6.7 04/30/2014   HGBA1C 6.4 01/20/2014   HGBA1C 7.0 09/09/2013     Assessment: Diabetes control: good control (HgbA1C at goal) Progress toward A1C goal:  at goal Comments: Well controlled on metformin 1000 mg bid  Plan: Medications:  continue current medications Home glucose monitoring: Frequency:   Timing:   Instruction/counseling given: reminded to bring blood glucose meter & log to each visit and reminded to bring medications to each visit Educational resources provided: brochure (denies) Self management tools provided:   Other plans: check lipid panel, urine microalbumin

## 2014-04-30 NOTE — Assessment & Plan Note (Signed)
BP Readings from Last 3 Encounters:  04/30/14 136/80  01/20/14 130/72  09/09/13 149/89    Lab Results  Component Value Date   NA 140 09/09/2013   K 3.8 09/09/2013   CREATININE 0.87 09/09/2013    Assessment: Blood pressure control: controlled Progress toward BP goal:  at goal Comments: Well controlled on amlodipine 10 mg daily, quinapril-HCTZ 20-25 mg daily  Plan: Medications:  continue current medications Educational resources provided: brochure (denies) Self management tools provided:   Other plans: none

## 2014-05-01 LAB — MICROALBUMIN / CREATININE URINE RATIO
Creatinine, Urine: 48.9 mg/dL
Microalb Creat Ratio: 24.5 mg/g (ref 0.0–30.0)
Microalb, Ur: 1.2 mg/dL (ref <2.0)

## 2014-05-04 ENCOUNTER — Ambulatory Visit: Payer: Self-pay

## 2014-05-04 NOTE — Progress Notes (Signed)
Internal Medicine Clinic Attending  Case discussed with Dr. Rothman at the time of the visit.  We reviewed the resident's history and exam and pertinent patient test results.  I agree with the assessment, diagnosis, and plan of care documented in the resident's note. 

## 2014-05-19 ENCOUNTER — Other Ambulatory Visit: Payer: Self-pay | Admitting: *Deleted

## 2014-05-19 DIAGNOSIS — E083299 Diabetes mellitus due to underlying condition with mild nonproliferative diabetic retinopathy without macular edema, unspecified eye: Secondary | ICD-10-CM

## 2014-05-19 MED ORDER — METFORMIN HCL 1000 MG PO TABS: 1000.0000 mg | ORAL_TABLET | Freq: Two times a day (BID) | ORAL | Status: DC

## 2014-07-13 ENCOUNTER — Encounter: Payer: Self-pay | Admitting: *Deleted

## 2014-09-02 ENCOUNTER — Telehealth: Payer: Self-pay | Admitting: *Deleted

## 2014-09-02 NOTE — Telephone Encounter (Signed)
Do they have benicar hct 20-12.5?  That would be the safest equivalent dose.  May need benicar 40-12.5 in future.  Can you check which dosages they have.

## 2014-09-02 NOTE — Telephone Encounter (Signed)
accuretic is no longer available for free at the [facility], benicar hct is free, could we change this so he can continue to get free med?

## 2014-09-03 MED ORDER — OLMESARTAN MEDOXOMIL-HCTZ 20-12.5 MG PO TABS: 1.0000 | ORAL_TABLET | Freq: Every day | ORAL | Status: DC

## 2014-09-03 NOTE — Telephone Encounter (Signed)
Change made. Sept appt PCP pls.

## 2014-09-03 NOTE — Telephone Encounter (Signed)
They have the 20/12.5

## 2014-09-03 NOTE — Telephone Encounter (Signed)
Per dr Software engineer pt needs sept appt w/ pcp

## 2014-11-03 ENCOUNTER — Ambulatory Visit: Payer: Self-pay

## 2014-11-08 ENCOUNTER — Ambulatory Visit: Payer: Self-pay

## 2014-11-08 ENCOUNTER — Encounter: Payer: Self-pay | Admitting: Internal Medicine

## 2014-11-17 ENCOUNTER — Ambulatory Visit (INDEPENDENT_AMBULATORY_CARE_PROVIDER_SITE_OTHER): Payer: Self-pay | Admitting: Internal Medicine

## 2014-11-17 ENCOUNTER — Encounter: Payer: Self-pay | Admitting: Internal Medicine

## 2014-11-17 DIAGNOSIS — E7800 Pure hypercholesterolemia, unspecified: Secondary | ICD-10-CM

## 2014-11-17 DIAGNOSIS — E119 Type 2 diabetes mellitus without complications: Secondary | ICD-10-CM

## 2014-11-17 DIAGNOSIS — I10 Essential (primary) hypertension: Secondary | ICD-10-CM

## 2014-11-17 DIAGNOSIS — Z79899 Other long term (current) drug therapy: Secondary | ICD-10-CM

## 2014-11-17 DIAGNOSIS — E083299 Diabetes mellitus due to underlying condition with mild nonproliferative diabetic retinopathy without macular edema, unspecified eye: Secondary | ICD-10-CM

## 2014-11-17 DIAGNOSIS — Z0000 Encounter for general adult medical examination without abnormal findings: Secondary | ICD-10-CM

## 2014-11-17 LAB — GLUCOSE, CAPILLARY: Glucose-Capillary: 116 mg/dL — ABNORMAL HIGH (ref 65–99)

## 2014-11-17 LAB — POCT GLYCOSYLATED HEMOGLOBIN (HGB A1C): Hemoglobin A1C: 6.2

## 2014-11-17 MED ORDER — METFORMIN HCL 1000 MG PO TABS: 500.0000 mg | ORAL_TABLET | Freq: Two times a day (BID) | ORAL | Status: DC

## 2014-11-17 NOTE — Progress Notes (Addendum)
Patient ID: Ross Warner, male   DOB: Nov 23, 1960, 54 y.o.   MRN: [id number]    Subjective:   Patient ID: Ross Warner male   DOB: 1960-07-24 54 y.o.   MRN: [id number]  HPI: [name] is a 54 y.o. man with T2DM, HTN, HLD who is here for a 6 month visit.  Please see the A&P for the status of the pt's chronic medical problems.    Past Medical History  Diagnosis Date  . Diabetes mellitus without complication   . Hyperlipidemia   . Hypertension    Current Outpatient Prescriptions  Medication Sig Dispense Refill  . amLODipine (NORVASC) 10 MG tablet Take 1 tablet (10 mg total) by mouth daily. 90 tablet 1  . aspirin 81 MG tablet Take 81 mg by mouth daily.    Marland Kitchen glucose blood (AGAMATRIX PRESTO TEST) test strip Use as instructed 100 each 12  . Lancets MISC 1 each by Does not apply route 3 (three) times daily. 100 each 11  . metFORMIN (GLUCOPHAGE) 1000 MG tablet Take 0.5 tablets (500 mg total) by mouth 2 (two) times daily with a meal. 180 tablet 3  . olmesartan-hydrochlorothiazide (BENICAR HCT) 20-12.5 MG per tablet Take 1 tablet by mouth daily. 90 tablet 3  . omega-3 acid ethyl esters (LOVAZA) 1 G capsule Take 2 capsules (2 g total) by mouth 2 (two) times daily. 360 capsule 4  . rosuvastatin (CRESTOR) 10 MG tablet Take 1 tablet (10 mg total) by mouth daily. 90 tablet 1   No current facility-administered medications for this visit.   Family History  Problem Relation Age of Onset  . Hypertension Mother   . Diabetes Mother   . Colon cancer Neg Hx    Social History   Social History  . Marital Status: Married    Spouse Name: N/A  . Number of Children: N/A  . 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 Smoking status: Never Smoker   . Smokeless tobacco: Never Used  . Alcohol Use: No  . Drug Use: No  . Sexual Activity: Not Asked   Other Topics Concern  . 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    Education: College level          Review of Systems: Review of Systems  Constitutional: Positive for weight loss. Negative for fever.       Intentional weight loss due to exercise  HENT: Negative.   Respiratory: Negative for cough, shortness of breath and wheezing.   Cardiovascular: Negative.  Negative for chest pain, palpitations and orthopnea.  Gastrointestinal: Negative.  Negative for nausea, vomiting, abdominal pain and diarrhea.  Musculoskeletal: Negative for joint pain.  Neurological: Negative for dizziness.    Objective:  Physical Exam: Filed Vitals:   11/17/14 1045  BP: 143/82  Pulse: 81  Temp: 98.5 F (36.9 C)  TempSrc: Oral  Height: 5\' 6"  (1.676 m)  Weight: 163 lb 3.2 oz (74.027 kg)  SpO2: 100%   Physical Exam  Constitutional: He is oriented to person, place, and time. He appears well-developed and well-nourished.  HENT:  Head: Normocephalic and atraumatic.  Eyes: EOM are normal. Pupils are equal, round, and reactive to light.  Neck: Normal range of motion. Neck supple.  Cardiovascular: Normal rate, regular rhythm, normal heart sounds and intact distal pulses.  Exam reveals no friction rub.   No murmur heard. Pulmonary/Chest: Effort normal and breath sounds normal. He has no wheezes.  Abdominal: Soft. Bowel sounds are normal. He exhibits no distension. There is no tenderness.  Neurological: He is alert and oriented to person, place, and time. He has normal reflexes.    Assessment & Plan:   Please see problem based charting for assessment and plan

## 2014-11-17 NOTE — Patient Instructions (Signed)
Thank you for your visit today Please continue what you are doing right now- you are doing a great job with the exercise and diet Please take metformin 500 mg twice a day (instead of 1000 mg twice a day) Please continue your other medicines as directed Please follow up in 3 months

## 2014-11-17 NOTE — Assessment & Plan Note (Signed)
BP Readings from Last 3 Encounters:  11/17/14 143/82  04/30/14 136/80  01/20/14 130/72  Well controlled Continue olmesartan-hctz 20-12.5 daily

## 2014-11-17 NOTE — Assessment & Plan Note (Signed)
Repeat colonoscopy due in 2018 Declined flu shot

## 2014-11-17 NOTE — Assessment & Plan Note (Signed)
Last lipid panel WNL Continue crestor 10 mg daily

## 2014-11-17 NOTE — Assessment & Plan Note (Signed)
a1c 6.2 down from 6.7 6 months ago, no hypoglycemia, pt checks his cbgs once or twice a day. Pt is continuing lifestyle modification and exercise   Encouraged to continue his intensive lifestyle modification as patient is hoping to get off the meds. Plan- decrease metformin to 500 mg BID

## 2014-11-18 NOTE — Progress Notes (Signed)
Internal Medicine Clinic Attending  I saw and evaluated the patient.  I personally confirmed the key portions of the history and exam documented by Dr. Saraiya and I reviewed pertinent patient test results.  The assessment, diagnosis, and plan were formulated together and I agree with the documentation in the resident's note.  

## 2014-12-02 ENCOUNTER — Other Ambulatory Visit: Payer: Self-pay | Admitting: *Deleted

## 2014-12-02 DIAGNOSIS — I10 Essential (primary) hypertension: Secondary | ICD-10-CM

## 2014-12-02 MED ORDER — AMLODIPINE BESYLATE 10 MG PO TABS: 10.0000 mg | ORAL_TABLET | Freq: Every day | ORAL | Status: DC

## 2014-12-03 NOTE — Telephone Encounter (Signed)
Rx called in to pharmacy. 

## 2014-12-10 ENCOUNTER — Telehealth: Payer: Self-pay | Admitting: Internal Medicine

## 2014-12-10 NOTE — Telephone Encounter (Signed)
Pt requesting refill on Accuretic which was changed to Benicar in July per EPIC. Pt states he has been taking Accuretic since July which ran out  beginning of this month. And no one told him it had been change to Benicar. I called [facility] - had to leave verbal order for Benicar, just in case they had not received in July. Pt called / informed - rx was called to the pharmacy.

## 2014-12-10 NOTE — Telephone Encounter (Signed)
Pt called requesting accuretic to be filled.

## 2014-12-16 ENCOUNTER — Other Ambulatory Visit: Payer: Self-pay | Admitting: Internal Medicine

## 2014-12-16 NOTE — Telephone Encounter (Signed)
Pt requesting Benicar to be filled @ [facility].

## 2014-12-16 NOTE — Telephone Encounter (Signed)
Pt had active script at [facility], spoke w/ Juliann Pulse

## 2014-12-22 ENCOUNTER — Telehealth: Payer: Self-pay | Admitting: *Deleted

## 2014-12-22 NOTE — Telephone Encounter (Signed)
Pt walked in with empty bottle of accuretic 20-12.5mg -requesting refill.  Upon further review of pt's chart, [facility] was no longer able to get the accuretic and pt's rx was changed to Benicar.  Pt has been getting #14 of Benicar at a time from [facility] and didn't understand why-when he had been getting 90-day supply before.  [facility]-they are waiting on a shipment of medication to come in, once medication arrives, pt can go back to getting his usual 7mth supply like he was getting before.  Pt verbalized understanding. Regenia Skeeter, Myles Mallicoat Cassady10/26/20162:45 PM

## 2015-03-07 ENCOUNTER — Encounter: Payer: Self-pay | Admitting: Internal Medicine

## 2015-03-08 ENCOUNTER — Other Ambulatory Visit: Payer: Self-pay | Admitting: *Deleted

## 2015-03-08 DIAGNOSIS — E7800 Pure hypercholesterolemia, unspecified: Secondary | ICD-10-CM

## 2015-03-09 MED ORDER — ROSUVASTATIN CALCIUM 10 MG PO TABS
10.0000 mg | ORAL_TABLET | Freq: Every day | ORAL | Status: DC
Start: 2015-03-09 — End: 2015-04-04

## 2015-04-04 ENCOUNTER — Ambulatory Visit (INDEPENDENT_AMBULATORY_CARE_PROVIDER_SITE_OTHER): Payer: Self-pay | Admitting: Internal Medicine

## 2015-04-04 ENCOUNTER — Encounter: Payer: Self-pay | Admitting: Internal Medicine

## 2015-04-04 DIAGNOSIS — E119 Type 2 diabetes mellitus without complications: Secondary | ICD-10-CM

## 2015-04-04 DIAGNOSIS — Z0000 Encounter for general adult medical examination without abnormal findings: Secondary | ICD-10-CM

## 2015-04-04 DIAGNOSIS — E7800 Pure hypercholesterolemia, unspecified: Secondary | ICD-10-CM

## 2015-04-04 DIAGNOSIS — Z7984 Long term (current) use of oral hypoglycemic drugs: Secondary | ICD-10-CM

## 2015-04-04 DIAGNOSIS — N529 Male erectile dysfunction, unspecified: Secondary | ICD-10-CM

## 2015-04-04 DIAGNOSIS — E083299 Diabetes mellitus due to underlying condition with mild nonproliferative diabetic retinopathy without macular edema, unspecified eye: Secondary | ICD-10-CM

## 2015-04-04 DIAGNOSIS — I10 Essential (primary) hypertension: Secondary | ICD-10-CM

## 2015-04-04 LAB — GLUCOSE, CAPILLARY: Glucose-Capillary: 120 mg/dL — ABNORMAL HIGH (ref 65–99)

## 2015-04-04 LAB — POCT GLYCOSYLATED HEMOGLOBIN (HGB A1C): Hemoglobin A1C: 6.4

## 2015-04-04 MED ORDER — METFORMIN HCL 1000 MG PO TABS: 500.0000 mg | ORAL_TABLET | Freq: Two times a day (BID) | ORAL | Status: DC

## 2015-04-04 MED ORDER — SILDENAFIL CITRATE 20 MG PO TABS: 100.0000 mg | ORAL_TABLET | ORAL | Status: DC | PRN

## 2015-04-04 MED ORDER — AMLODIPINE BESYLATE 10 MG PO TABS: 10.0000 mg | ORAL_TABLET | Freq: Every day | ORAL | Status: DC

## 2015-04-04 MED ORDER — OLMESARTAN MEDOXOMIL-HCTZ 20-12.5 MG PO TABS: 1.0000 | ORAL_TABLET | Freq: Every day | ORAL | Status: DC

## 2015-04-04 MED ORDER — ROSUVASTATIN CALCIUM 10 MG PO TABS: 10.0000 mg | ORAL_TABLET | Freq: Every day | ORAL | Status: DC

## 2015-04-04 NOTE — Assessment & Plan Note (Signed)
His hemoglobin a1c has been in the 6-7 range in the last year. Today's A1c was 6.4, which was slightly up from 6.2 last time it was checked  Pt is compliant with his metformin 500 mg BID and takes it every day. He denies any lows and he checks his cbgs daily. He did not bring his meter.   I counseled again on he importance of compliance and reminded him on lifestyle modification and diet.  RTC in 3 months

## 2015-04-04 NOTE — Patient Instructions (Signed)
Thank you for your visit today. It was a pleasure to see you again. Please  Continue your metformin. Please continue your blood pressure medicine. Please continue to work on losing your weight and your diet At your request, we will defer the colonoscopy follow up till the next time you come  Please follow up in 3 months

## 2015-04-04 NOTE — Assessment & Plan Note (Signed)
His last colonoscopy was done in March 2015 and it showed 3 cm polyp, and they had advised a repeat colonoscopy in 1 year. I advised the pt of the same, and pt said he will defer the referral until next visit.  He also declined influenza vaccine.  Pt will undergo eye exam at next visit

## 2015-04-04 NOTE — Assessment & Plan Note (Signed)
Pt is compliant with his crestor 10 mg daily. His last lipid panel was WNL His ASCVD score around 20%.    Continue crestor 10 mg daily. We will defer checking lipid panel until next visit.

## 2015-04-04 NOTE — Assessment & Plan Note (Addendum)
Pt wanted a prescription for sildenafil. He was previously taking it and had experienced no side effects from it including hypotension, dizziness and blurry vision. He says he is not able to achieve erection but has night time one. He also has long standing diabetes so likely this is due to his T2DM, and also drug induced, as he is on hydrochlorothiazide that can exacerbate it. Pt is not interested in switching his hypertension medicine.    I gave him a written prescription for generic sildenafil.

## 2015-04-04 NOTE — Progress Notes (Signed)
    Subjective:   Patient ID: Ross Warner male   DOB: 1960/07/16 55 y.o.   MRN: [id number]  HPI: [name] is a 55 y.o. man with PMH listed below who is here for a follow up of his chronic medical problems.   Please see Problem List/A&P for the status of the patient's chronic medical problems      Past Medical History  Diagnosis Date  . Diabetes mellitus without complication ([place])   . Hyperlipidemia   . Hypertension    Current Outpatient Prescriptions  Medication Sig Dispense Refill  . amLODipine (NORVASC) 10 MG tablet Take 1 tablet (10 mg total) by mouth daily. 90 tablet 1  . aspirin 81 MG tablet Take 81 mg by mouth daily.    Marland Kitchen glucose blood (AGAMATRIX PRESTO TEST) test strip Use as instructed 100 each 12  . Lancets MISC 1 each by Does not apply route 3 (three) times daily. 100 each 11  . metFORMIN (GLUCOPHAGE) 1000 MG tablet Take 0.5 tablets (500 mg total) by mouth 2 (two) times daily with a meal. 180 tablet 3  . olmesartan-hydrochlorothiazide (BENICAR HCT) 20-12.5 MG tablet Take 1 tablet by mouth daily. 90 tablet 3  . omega-3 acid ethyl esters (LOVAZA) 1 G capsule Take 2 capsules (2 g total) by mouth 2 (two) times daily. 360 capsule 4  . rosuvastatin (CRESTOR) 10 MG tablet Take 1 tablet (10 mg total) by mouth daily. 90 tablet 3  . sildenafil (REVATIO) 20 MG tablet Take 5 tablets (100 mg total) by mouth as needed. 60 tablet 0   No current facility-administered medications for this visit.   Family History  Problem Relation Age of Onset  . Hypertension Mother   . Diabetes Mother   . Colon cancer Neg Hx    Social History   Social History  . Marital Status: Married    Spouse Name: N/A  . Number of Children: N/A  . 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 Smoking status: Never Smoker   . Smokeless tobacco: Never Used  . Alcohol Use: No  . Drug Use: No  . Sexual  Activity: Not Asked   Other Topics Concern  . 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    Education: College level          Review of Systems: A comprehensive ROS is obtained and is noted in either in HPI or in problem list, and remainder of comprehensive ROS otherwise negative   Objective:  Physical Exam: Filed Vitals:   04/04/15 1433  BP: 149/77  Pulse: 83  Temp: 98.4 F (36.9 C)  TempSrc: Oral  Height: 5\' 6"  (1.676 m)  Weight: 163 lb 9.6 oz (74.208 kg)  SpO2: 99%    General: A&O, in NAD CV: RRR, normal s1, s2, no m/r/g, no carotid bruits appreciated Resp: equal and symmetric breath sounds, no wheezing or crackles  Abdomen: soft, nontender, nondistended, +BS in all 4 quadrants Skin: warm, dry, intact, no open lesions or rashes noted Extremities: pulses intact b/l,    Assessment & Plan:   Please see problem based charting for assessment and plan

## 2015-04-04 NOTE — Assessment & Plan Note (Signed)
Pt's blood pressures have been very well controlled on olmesartan-hctz and amlodipine. He is very compliant with his meds and takes it daily. Today, he took them 45 minutes before he came here and hence they were a little elevated at 149/77. I explained to him that the goal systolic is less than [id number].  Plan will be to continue current meds and see him in 3 months

## 2015-04-06 NOTE — Progress Notes (Signed)
Internal Medicine Clinic Attending  Case discussed with Dr. Saraiya at the time of the visit.  We reviewed the resident's history and exam and pertinent patient test results.  I agree with the assessment, diagnosis, and plan of care documented in the resident's note.  

## 2015-05-04 ENCOUNTER — Other Ambulatory Visit: Payer: Self-pay | Admitting: Internal Medicine

## 2015-05-11 ENCOUNTER — Ambulatory Visit: Payer: Self-pay

## 2015-06-06 ENCOUNTER — Other Ambulatory Visit: Payer: Self-pay

## 2015-06-06 NOTE — Telephone Encounter (Signed)
Filled by dr Tiburcio Pea on 2/6 #90 w/ 1 additional, called [facility] gave them the script verbally, they will call him when ready

## 2015-06-06 NOTE — Telephone Encounter (Signed)
Pt requesting amlodipine to be filled [facility].

## 2015-06-29 ENCOUNTER — Other Ambulatory Visit: Payer: Self-pay | Admitting: Internal Medicine

## 2015-11-01 ENCOUNTER — Other Ambulatory Visit: Payer: Self-pay | Admitting: *Deleted

## 2015-11-01 DIAGNOSIS — E083299 Diabetes mellitus due to underlying condition with mild nonproliferative diabetic retinopathy without macular edema, unspecified eye: Secondary | ICD-10-CM

## 2015-11-02 MED ORDER — METFORMIN HCL 1000 MG PO TABS: 500.0000 mg | ORAL_TABLET | Freq: Two times a day (BID) | ORAL | 1 refills | Status: DC

## 2015-11-02 NOTE — Telephone Encounter (Signed)
Please call in thx

## 2015-11-02 NOTE — Telephone Encounter (Signed)
Rx called to [facility].

## 2015-11-07 ENCOUNTER — Ambulatory Visit (INDEPENDENT_AMBULATORY_CARE_PROVIDER_SITE_OTHER): Payer: Self-pay | Admitting: Internal Medicine

## 2015-11-07 VITALS — BP 147/76 | HR 81 | Temp 98.0°F | Ht 66.0 in | Wt 165.6 lb

## 2015-11-07 DIAGNOSIS — Z79899 Other long term (current) drug therapy: Secondary | ICD-10-CM

## 2015-11-07 DIAGNOSIS — E7800 Pure hypercholesterolemia, unspecified: Secondary | ICD-10-CM

## 2015-11-07 DIAGNOSIS — I10 Essential (primary) hypertension: Secondary | ICD-10-CM

## 2015-11-07 DIAGNOSIS — E083299 Diabetes mellitus due to underlying condition with mild nonproliferative diabetic retinopathy without macular edema, unspecified eye: Secondary | ICD-10-CM

## 2015-11-07 DIAGNOSIS — Z0000 Encounter for general adult medical examination without abnormal findings: Secondary | ICD-10-CM

## 2015-11-07 DIAGNOSIS — E119 Type 2 diabetes mellitus without complications: Secondary | ICD-10-CM

## 2015-11-07 DIAGNOSIS — E113299 Type 2 diabetes mellitus with mild nonproliferative diabetic retinopathy without macular edema, unspecified eye: Secondary | ICD-10-CM

## 2015-11-07 DIAGNOSIS — Z7984 Long term (current) use of oral hypoglycemic drugs: Secondary | ICD-10-CM

## 2015-11-07 LAB — POCT GLYCOSYLATED HEMOGLOBIN (HGB A1C): HEMOGLOBIN A1C: 6.4

## 2015-11-07 LAB — GLUCOSE, CAPILLARY: Glucose-Capillary: 162 mg/dL — ABNORMAL HIGH (ref 65–99)

## 2015-11-07 MED ORDER — OLMESARTAN MEDOXOMIL-HCTZ 20-12.5 MG PO TABS: 1.0000 | ORAL_TABLET | Freq: Every day | ORAL | 3 refills | Status: DC

## 2015-11-07 MED ORDER — ROSUVASTATIN CALCIUM 10 MG PO TABS: 10.0000 mg | ORAL_TABLET | Freq: Every day | ORAL | 3 refills | Status: DC

## 2015-11-07 MED ORDER — SILDENAFIL CITRATE 20 MG PO TABS
ORAL_TABLET | ORAL | 0 refills | Status: DC
Start: 2015-11-07 — End: 2015-12-26

## 2015-11-07 MED ORDER — ROSUVASTATIN CALCIUM 20 MG PO TABS: 20.0000 mg | ORAL_TABLET | Freq: Every day | ORAL | 3 refills | Status: DC

## 2015-11-07 MED ORDER — AMLODIPINE BESYLATE 10 MG PO TABS: 10.0000 mg | ORAL_TABLET | Freq: Every day | ORAL | 3 refills | Status: DC

## 2015-11-07 MED ORDER — METFORMIN HCL 1000 MG PO TABS: 500.0000 mg | ORAL_TABLET | Freq: Two times a day (BID) | ORAL | 1 refills | Status: DC

## 2015-11-07 NOTE — Assessment & Plan Note (Signed)
BP Readings from Last 3 Encounters:  11/07/15 (!) 147/76  04/04/15 (!) 149/77  11/17/14 (!) 143/82   A: Patient is here for medication refills.  His BP is elevated today above goal, but he did not take his olmesartan-HCTZ pill today.  Otherwise, he reports adherence to medications.  He denies any headaches, blurred vision, or lightheadedness.  P: - continue current medications of amlodipine 10mg  daily and olmesartan-HCTZ 20-12.5mg  daily - BMET ordered today as has not been checked since July 2015.  SCr and K otherwise have been okay - has f/u with his PCP in October.  I recommended he keep this appointment so that we can continue to follow up his BP.  Consider increasing his Benicar if not at goal.

## 2015-11-07 NOTE — Assessment & Plan Note (Signed)
A/P: - declined flu vaccine.  I updated this in the HM tab.

## 2015-11-07 NOTE — Progress Notes (Signed)
   CC: here for medication refills on HTN, DM, HLD meds  HPI:  Ross Warner is a 55 y.o. man with a past medical history listed below here today for follow up of his HTN, HLD, DM.   For details of today's visit and the status of his chronic medical issues please refer to the assessment and plan.   Past Medical History:  Diagnosis Date  . Diabetes mellitus without complication ([place])   . Hyperlipidemia   . Hypertension     Review of Systems:   Review of Systems  Constitutional: Negative for chills and fever.  Eyes: Negative for blurred vision.  Respiratory: Negative for cough and shortness of breath.   Cardiovascular: Negative for chest pain and leg swelling.  Genitourinary: Negative for frequency.  Neurological: Negative for headaches.  Endo/Heme/Allergies: Negative for polydipsia.     Physical Exam:  Vitals:   11/07/15 0946  BP: (!) 147/76  Pulse: 81  Temp: 98 F (36.7 C)  TempSrc: Oral  SpO2: 100%  Weight: 165 lb 9.6 oz (75.1 kg)  Height: 5\' 6"  (1.676 m)   Physical Exam  Constitutional: He is oriented to person, place, and time and well-developed, well-nourished, and in no distress.  HENT:  Head: Normocephalic and atraumatic.  Cardiovascular: Normal rate, regular rhythm and normal heart sounds.   Pulmonary/Chest: Effort normal and breath sounds normal.  Musculoskeletal: Normal range of motion.  Neurological: He is alert and oriented to person, place, and time.  Skin: Skin is warm and dry.  Psychiatric: Mood and affect normal.    Assessment & Plan:   See Encounters Tab for problem based charting.  Patient discussed with Dr. Eppie Gibson.  Essential hypertension BP Readings from Last 3 Encounters:  11/07/15 (!) 147/76  04/04/15 (!) 149/77  11/17/14 (!) 143/82   A: Patient is here for medication refills.  His BP is elevated today above goal, but he did not take his olmesartan-HCTZ pill today.  Otherwise, he reports adherence to medications.  He denies  any headaches, blurred vision, or lightheadedness.  P: - continue current medications of amlodipine 10mg  daily and olmesartan-HCTZ 20-12.5mg  daily - BMET ordered today as has not been checked since July 2015.  SCr and K otherwise have been okay - has f/u with his PCP in October.  I recommended he keep this appointment so that we can continue to follow up his BP.  Consider increasing his Benicar if not at goal.  Type 2 diabetes, controlled Lab Results  Component Value Date   HGBA1C 6.4 11/07/2015   A: Patient is adherent to his metformin 500mg  BID.  His A1C today is unchanged from February 2017.  He does not have any symptoms of hyperglycemia such as polyuria or polydipsia.  P: - repeat A1C in 3 months. - he is due for foot exam and eye exam and I told him to follow up with his PCP as scheduled next month to take care of these issues.  Health care maintenance A/P: - declined flu vaccine.  I updated this in the HM tab.  HYPERCHOLESTEROLEMIA A: Patient needs a refill of his Crestor 10mg  daily.  His ASCVD 10-year risk is >20%, likely increased secondary to his elevated systolic blood pressures.  The recommendation is to be on high-intensity statin based on his last lipid panel.  P: - I have increased his Crestor to 20mg  daily

## 2015-11-07 NOTE — Patient Instructions (Signed)
Thank you for coming to see me today. It was a pleasure. Today we talked about:   Blood Pressure: I have refilled your medications.  Diabetes: you are doing a great job.  Please follow up with Dr. Tiburcio Pea because you will need a foot and eye exam next month.  Cholesterol: I have refilled your Crestor.  Please follow-up with [place] next month with Dr. Tiburcio Pea.  If you have any questions or concerns, please do not hesitate to call the office at [phone].  Take Care,   Jule Ser, DO

## 2015-11-07 NOTE — Assessment & Plan Note (Signed)
Lab Results  Component Value Date   HGBA1C 6.4 11/07/2015   A: Patient is adherent to his metformin 500mg  BID.  His A1C today is unchanged from February 2017.  He does not have any symptoms of hyperglycemia such as polyuria or polydipsia.  P: - repeat A1C in 3 months. - he is due for foot exam and eye exam and I told him to follow up with his PCP as scheduled next month to take care of these issues.

## 2015-11-07 NOTE — Assessment & Plan Note (Signed)
A: Patient needs a refill of his Crestor 10mg  daily.  His ASCVD 10-year risk is >20%, likely increased secondary to his elevated systolic blood pressures.  The recommendation is to be on high-intensity statin based on his last lipid panel.  P: - I have increased his Crestor to 20mg  daily

## 2015-11-08 LAB — BMP8+ANION GAP
ANION GAP: 19 mmol/L — AB (ref 10.0–18.0)
BUN/Creatinine Ratio: 10 (ref 9–20)
BUN: 10 mg/dL (ref 6–24)
CO2: 25 mmol/L (ref 18–29)
CREATININE: 0.98 mg/dL (ref 0.76–1.27)
Calcium: 10.1 mg/dL (ref 8.7–10.2)
Chloride: 99 mmol/L (ref 96–106)
GFR, EST AFRICAN AMERICAN: 101 mL/min/{1.73_m2} (ref >59)
GFR, EST NON AFRICAN AMERICAN: 87 mL/min/{1.73_m2} (ref >59)
Glucose: 137 mg/dL — ABNORMAL HIGH (ref 65–99)
POTASSIUM: 3.6 mmol/L (ref 3.5–5.2)
SODIUM: 143 mmol/L (ref 134–144)

## 2015-11-08 NOTE — Progress Notes (Signed)
Case discussed with Dr. Wallace at the time of the visit.  We reviewed the resident's history and exam and pertinent patient test results.  I agree with the assessment, diagnosis, and plan of care documented in the resident's note. 

## 2015-11-11 ENCOUNTER — Ambulatory Visit: Payer: Self-pay

## 2015-12-06 ENCOUNTER — Other Ambulatory Visit: Payer: Self-pay | Admitting: Internal Medicine

## 2015-12-06 NOTE — Telephone Encounter (Signed)
PT requesting a refill on amLODipine (NORVASC) 10 MG tablet  metFORMIN (GLUCOPHAGE) 1000 MG tablet  and his rosuvastatin (CRESTOR) 20 MG tablet

## 2015-12-06 NOTE — Telephone Encounter (Signed)
These scripts are at the [facility] and have been, they were prepared for pick up this am, pt only needs to go pick up,

## 2015-12-07 ENCOUNTER — Telehealth: Payer: Self-pay | Admitting: *Deleted

## 2015-12-07 NOTE — Telephone Encounter (Signed)
By Chart review he should be on a high intensity statin, the only medication that would be high intensity would be 40mg  of atorvastatin.  This would cost $6 dollars a month at the [facility], is he OK with this .

## 2015-12-07 NOTE — Telephone Encounter (Signed)
Per [facility], they will no longer be able to get crestor for free LIVALO WILL BE THE FREE drug to replace crestor By purchase crestor can be changed to atorvastatin, pravastatin or simvastatin Please choose and send new script to [place] thanks

## 2015-12-08 ENCOUNTER — Other Ambulatory Visit: Payer: Self-pay | Admitting: *Deleted

## 2015-12-08 MED ORDER — ATORVASTATIN CALCIUM 40 MG PO TABS: 40.0000 mg | ORAL_TABLET | Freq: Every day | ORAL | 3 refills | Status: DC

## 2015-12-08 NOTE — Telephone Encounter (Signed)
[facility]

## 2015-12-23 ENCOUNTER — Telehealth: Payer: Self-pay | Admitting: Internal Medicine

## 2015-12-23 NOTE — Telephone Encounter (Signed)
APT. REMINDER CALL, LMTCB °

## 2015-12-26 ENCOUNTER — Ambulatory Visit (INDEPENDENT_AMBULATORY_CARE_PROVIDER_SITE_OTHER): Payer: Self-pay | Admitting: Internal Medicine

## 2015-12-26 DIAGNOSIS — E113299 Type 2 diabetes mellitus with mild nonproliferative diabetic retinopathy without macular edema, unspecified eye: Secondary | ICD-10-CM

## 2015-12-26 DIAGNOSIS — Z0000 Encounter for general adult medical examination without abnormal findings: Secondary | ICD-10-CM

## 2015-12-26 DIAGNOSIS — Z7982 Long term (current) use of aspirin: Secondary | ICD-10-CM

## 2015-12-26 DIAGNOSIS — Z79899 Other long term (current) drug therapy: Secondary | ICD-10-CM

## 2015-12-26 DIAGNOSIS — I10 Essential (primary) hypertension: Secondary | ICD-10-CM

## 2015-12-26 DIAGNOSIS — Z7984 Long term (current) use of oral hypoglycemic drugs: Secondary | ICD-10-CM

## 2015-12-26 DIAGNOSIS — E119 Type 2 diabetes mellitus without complications: Secondary | ICD-10-CM

## 2015-12-26 MED ORDER — METFORMIN HCL 1000 MG PO TABS: 500.0000 mg | ORAL_TABLET | Freq: Two times a day (BID) | ORAL | 1 refills | Status: DC

## 2015-12-26 MED ORDER — SILDENAFIL CITRATE 20 MG PO TABS: ORAL_TABLET | ORAL | 0 refills | Status: DC

## 2015-12-26 MED ORDER — AMLODIPINE BESYLATE 10 MG PO TABS: 10.0000 mg | ORAL_TABLET | Freq: Every day | ORAL | 3 refills | Status: DC

## 2015-12-26 MED ORDER — ATORVASTATIN CALCIUM 40 MG PO TABS: 40.0000 mg | ORAL_TABLET | Freq: Every day | ORAL | 3 refills | Status: DC

## 2015-12-26 MED ORDER — ASPIRIN 81 MG PO TABS: 81.0000 mg | ORAL_TABLET | Freq: Every day | ORAL | 6 refills | Status: DC

## 2015-12-26 NOTE — Assessment & Plan Note (Signed)
Lab Results  Component Value Date   HGBA1C 6.4 11/07/2015   HGBA1C 6.4 04/04/2015   HGBA1C 6.2 11/17/2014    A1c was checked last month and was 6.4. Pt is compliant with his metformin 500 BID.   Plan -continue metformin 500 mg BID -pt will follow up in Jan for eye exam with Ms Butch Penny -foot exam today normal -on aspirin -on atorva 40 mg  -follow up in 3 months

## 2015-12-26 NOTE — Assessment & Plan Note (Signed)
BP Readings from Last 3 Encounters:  12/26/15 (!) 142/82  11/07/15 (!) 147/76  04/04/15 (!) 149/77   Pt's repeat BP was 142/84. He has always been slightly hypertensive per previous records. He is compliant with his amlodipine and olmesartan-hctz and took it before coming here. He does not wish any medication changes today. BMET was checked last time and was unremarkable   A: HTN not at goal on 3 agents   Plan -provided blood pressure log and advised him to note down the numbers in a pharmacy  -follow up in 4 weeks specifically for his hypertension -if continue to be elevated then will need to increase the benicar, most likely.

## 2015-12-26 NOTE — Progress Notes (Signed)
    CC: HTN, DM,, HM HPI: Ross Warner is a 55 y.o. man with PMH noted below here for HTN, DM, HM  Please see Problem List/A&P for the status of the patient's chronic medical problems   Past Medical History:  Diagnosis Date  . Diabetes mellitus without complication ([place])   . Hyperlipidemia   . Hypertension     Review of Systems: Denies fevers, chills, fatigue Denies SOB Denies n/v/abd pain Denies joint pain, falls  Physical Exam: Vitals:   12/26/15 0954 12/26/15 1014  BP: (!) 162/69 (!) 142/82  Pulse: 82   Temp: 97.6 F (36.4 C)   TempSrc: Oral   SpO2: 100%   Weight: 162 lb 1.6 oz (73.5 kg)     General: A&O, in NAD Neck: supple, midline trachea, CV: RRR, normal s1, s2, no m/r/g,  Resp: equal and symmetric breath sounds, no wheezing or crackles  Abdomen: soft, nontender, nondistended, +BS Extremities: pulses intact b/l, no edema,  Foot: no skin breakdown              Normal sensation to light touch and pinprick    Assessment & Plan:   See encounters tab for problem based medical decision making. Patient discussed with Dr. Eppie Gibson

## 2015-12-26 NOTE — Patient Instructions (Signed)
Thank you for your visit today  Please write down your blood pressure in the sheet we gave you when you visit pharmacy- at least 4-5 times a week. Please bring it back in 4 weeks.  Please follow up with Ms Butch Penny for eye exam  Please follow up in 4 weeks

## 2015-12-26 NOTE — Progress Notes (Signed)
Case discussed with Dr. Saraiya at the time of the visit.  We reviewed the resident's history and exam and pertinent patient test results.  I agree with the assessment, diagnosis and plan of care documented in the resident's note. 

## 2015-12-26 NOTE — Assessment & Plan Note (Signed)
Pt declined flu shot °

## 2015-12-29 ENCOUNTER — Telehealth: Payer: Self-pay | Admitting: Internal Medicine

## 2015-12-29 NOTE — Telephone Encounter (Signed)
LMTCB, TIME TO RENEW GCCN CARD, MUST DO APP FOR [facility] FIRST

## 2016-03-19 ENCOUNTER — Encounter: Payer: Self-pay | Admitting: Gastroenterology

## 2016-03-30 ENCOUNTER — Telehealth: Payer: Self-pay | Admitting: Internal Medicine

## 2016-03-30 NOTE — Telephone Encounter (Signed)
APT. REMINDER CALL, LMTCB °

## 2016-04-02 ENCOUNTER — Ambulatory Visit (INDEPENDENT_AMBULATORY_CARE_PROVIDER_SITE_OTHER): Payer: Self-pay | Admitting: Internal Medicine

## 2016-04-02 ENCOUNTER — Encounter: Payer: Self-pay | Admitting: Internal Medicine

## 2016-04-02 VITALS — BP 137/79 | HR 91 | Temp 98.4°F | Ht 66.0 in | Wt 160.1 lb

## 2016-04-02 DIAGNOSIS — E113299 Type 2 diabetes mellitus with mild nonproliferative diabetic retinopathy without macular edema, unspecified eye: Secondary | ICD-10-CM

## 2016-04-02 DIAGNOSIS — I10 Essential (primary) hypertension: Secondary | ICD-10-CM

## 2016-04-02 DIAGNOSIS — E119 Type 2 diabetes mellitus without complications: Secondary | ICD-10-CM

## 2016-04-02 DIAGNOSIS — Z0000 Encounter for general adult medical examination without abnormal findings: Secondary | ICD-10-CM

## 2016-04-02 DIAGNOSIS — Z7982 Long term (current) use of aspirin: Secondary | ICD-10-CM

## 2016-04-02 DIAGNOSIS — E7800 Pure hypercholesterolemia, unspecified: Secondary | ICD-10-CM

## 2016-04-02 DIAGNOSIS — E113293 Type 2 diabetes mellitus with mild nonproliferative diabetic retinopathy without macular edema, bilateral: Secondary | ICD-10-CM

## 2016-04-02 DIAGNOSIS — D126 Benign neoplasm of colon, unspecified: Secondary | ICD-10-CM

## 2016-04-02 DIAGNOSIS — Z86010 Personal history of colonic polyps: Secondary | ICD-10-CM

## 2016-04-02 DIAGNOSIS — Z7984 Long term (current) use of oral hypoglycemic drugs: Secondary | ICD-10-CM

## 2016-04-02 DIAGNOSIS — Z79899 Other long term (current) drug therapy: Secondary | ICD-10-CM

## 2016-04-02 LAB — POCT GLYCOSYLATED HEMOGLOBIN (HGB A1C): Hemoglobin A1C: 6.3

## 2016-04-02 LAB — GLUCOSE, CAPILLARY: GLUCOSE-CAPILLARY: 108 mg/dL — AB (ref 65–99)

## 2016-04-02 LAB — HM DIABETES EYE EXAM

## 2016-04-02 MED ORDER — SILDENAFIL CITRATE 20 MG PO TABS: ORAL_TABLET | ORAL | 0 refills | Status: DC

## 2016-04-02 MED ORDER — OLMESARTAN MEDOXOMIL-HCTZ 20-12.5 MG PO TABS: 1.0000 | ORAL_TABLET | Freq: Every day | ORAL | 3 refills | Status: DC

## 2016-04-02 MED ORDER — METFORMIN HCL 1000 MG PO TABS: 500.0000 mg | ORAL_TABLET | Freq: Two times a day (BID) | ORAL | 1 refills | Status: DC

## 2016-04-02 MED ORDER — ATORVASTATIN CALCIUM 40 MG PO TABS: 40.0000 mg | ORAL_TABLET | Freq: Every day | ORAL | 3 refills | Status: DC

## 2016-04-02 MED ORDER — AMLODIPINE BESYLATE 10 MG PO TABS: 10.0000 mg | ORAL_TABLET | Freq: Every day | ORAL | 3 refills | Status: DC

## 2016-04-02 MED ORDER — ASPIRIN 81 MG PO TABS: 81.0000 mg | ORAL_TABLET | Freq: Every day | ORAL | 6 refills | Status: DC

## 2016-04-02 NOTE — Assessment & Plan Note (Signed)
Patient is currently on atorvastatin.  Plan -check lipid panel

## 2016-04-02 NOTE — Assessment & Plan Note (Signed)
BP Readings from Last 3 Encounters:  04/02/16 137/79  12/26/15 (!) 142/82  11/07/15 (!) 147/76   Patient's BP was at goal today. He brought a log of his blood pressure and the majority of his systolic blood pressures were in the 120s.  Plan -continue amlodipine 10 mg -continue olmesartan-hctz 20-12.5 -RTC in 3 months

## 2016-04-02 NOTE — Patient Instructions (Signed)
Thank you for your visit today  Please continue your current medications. Both your diabetes and blood pressure is well controlled.  Please follow up with the gastroenterologist (stomach doctor) for your colonoscopy- You are due in March   Please follow up in 3 months

## 2016-04-02 NOTE — Progress Notes (Signed)
   CC: HTN, DM, HLD, HM HPI: [name] is a 56 y.o. man with PMH noted below here for HTN, DM, HLD, HM  Please see Problem List/A&P for the status of the patient's chronic medical problems   Past Medical History:  Diagnosis Date  . Diabetes mellitus without complication ([place])   . Hyperlipidemia   . Hypertension     Review of Systems:  Constitutional: Negative for fever, chills, weight loss and malaise/fatigue.  HEENT: No headaches, vision problems, cough, hearing problems  Respiratory: Negative for cough, shortness of breath  Gastrointestinal: Negative for , nausea, vomiting, abdominal pain, diarrhea and . No hematochezia, or melena Musculoskeletal: Negative for myalgias, joint pain.    Physical Exam: Vitals:   04/02/16 1527 04/02/16 1528  BP: 137/79   Pulse: 91   Temp: 98.4 F (36.9 C)   TempSrc: Oral   SpO2:  100%  Weight: 160 lb 1.6 oz (72.6 kg) 160 lb 1.6 oz (72.6 kg)  Height:  5\' 6"  (1.676 m)    General: A&O, in NAD Neck: supple, midline trachea, CV: RRR, normal s1, s2, no m/r/g,  Resp: equal and symmetric breath sounds, no wheezing or crackles  Abdomen: soft, nontender, nondistended, +BS   Assessment & Plan:   See encounters tab for problem based medical decision making. Patient discussed with Dr. Dareen Piano

## 2016-04-02 NOTE — Assessment & Plan Note (Signed)
Patient declined flu shot again- says it makes him 'sick' I urged him to reconsider.  Plan -defer flu shot -Patient had colonoscopy in 2015 which showed tubular 3 cm adenomatous polyp- he is due for colonoscopy. -referred to GI

## 2016-04-02 NOTE — Assessment & Plan Note (Signed)
Patient had colonoscopy in 2015 which showed tubular 3 cm adenomatous polyp- he is due for colonoscopy.  Plan -referred to GI

## 2016-04-02 NOTE — Assessment & Plan Note (Signed)
Lab Results  Component Value Date   HGBA1C 6.3 04/02/2016   HGBA1C 6.4 11/07/2015   HGBA1C 6.4 04/04/2015    Patient's A1c today was 6.3. He is compliant with his metformin 500 mg BID.  Plan -continue  Metformin- encouraged continued lifestyle changes -uptodate on foot exam -did eye exam today -on aspirin -on statin -RTC in 3 months

## 2016-04-03 ENCOUNTER — Other Ambulatory Visit (INDEPENDENT_AMBULATORY_CARE_PROVIDER_SITE_OTHER): Payer: Self-pay

## 2016-04-03 DIAGNOSIS — E7800 Pure hypercholesterolemia, unspecified: Secondary | ICD-10-CM

## 2016-04-04 LAB — LIPID PANEL
CHOLESTEROL TOTAL: 117 mg/dL (ref 100–199)
Chol/HDL Ratio: 2.9 ratio units (ref 0.0–5.0)
HDL: 41 mg/dL (ref >39)
LDL Calculated: 65 mg/dL (ref 0–99)
TRIGLYCERIDES: 57 mg/dL (ref 0–149)
VLDL CHOLESTEROL CAL: 11 mg/dL (ref 5–40)

## 2016-04-04 NOTE — Progress Notes (Signed)
Internal Medicine Clinic Attending  Case discussed with Dr. Saraiya at the time of the visit.  We reviewed the resident's history and exam and pertinent patient test results.  I agree with the assessment, diagnosis, and plan of care documented in the resident's note.  

## 2016-04-05 ENCOUNTER — Encounter: Payer: Self-pay | Admitting: Internal Medicine

## 2016-04-06 ENCOUNTER — Encounter: Payer: Self-pay | Admitting: Dietician

## 2016-05-10 ENCOUNTER — Ambulatory Visit: Payer: Self-pay

## 2016-05-25 ENCOUNTER — Encounter: Payer: Self-pay | Admitting: *Deleted

## 2016-05-31 ENCOUNTER — Encounter: Payer: Self-pay | Admitting: Gastroenterology

## 2016-06-21 ENCOUNTER — Ambulatory Visit (AMBULATORY_SURGERY_CENTER): Payer: Self-pay

## 2016-06-21 VITALS — Ht 66.0 in | Wt 164.8 lb

## 2016-06-21 DIAGNOSIS — Z86010 Personal history of colonic polyps: Secondary | ICD-10-CM

## 2016-06-21 MED ORDER — NA SULFATE-K SULFATE-MG SULF 17.5-3.13-1.6 GM/177ML PO SOLN: ORAL | 0 refills | Status: DC

## 2016-06-21 NOTE — Progress Notes (Signed)
Per pt, no allergies to soy or egg products.Pt not taking any weight loss meds or using  O2 at home.   Pr refused Emmi video.

## 2016-07-05 ENCOUNTER — Ambulatory Visit (AMBULATORY_SURGERY_CENTER): Payer: Self-pay | Admitting: Gastroenterology

## 2016-07-05 ENCOUNTER — Encounter: Payer: Self-pay | Admitting: Gastroenterology

## 2016-07-05 VITALS — BP 108/62 | HR 70 | Temp 97.8°F | Resp 14 | Ht 66.0 in | Wt 160.0 lb

## 2016-07-05 DIAGNOSIS — D123 Benign neoplasm of transverse colon: Secondary | ICD-10-CM

## 2016-07-05 DIAGNOSIS — Z86010 Personal history of colonic polyps: Secondary | ICD-10-CM

## 2016-07-05 MED ORDER — SODIUM CHLORIDE 0.9 % IV SOLN: 500.0000 mL | INTRAVENOUS | Status: DC

## 2016-07-05 NOTE — Progress Notes (Signed)
Report given to PACU, vss 

## 2016-07-05 NOTE — Op Note (Signed)
[place] Patient Name: Ross Warner Procedure Date: 07/05/2016 8:37 AM MRN: [id number] Endoscopist: Mallie Mussel L. Loletha Carrow , MD Age: 56 Referring MD:  Date of Birth: 10-21-1960 Gender: Male Account #: [id number] Procedure:                Colonoscopy Indications:              Surveillance: Personal history of adenomatous                            polyps on last colonoscopy 3 years ago ( 22 mm TA                            without dysplasia in 04/2013) Medicines:                Monitored Anesthesia Care Procedure:                Pre-Anesthesia Assessment:                           - Prior to the procedure, a History and Physical                            was performed, and patient medications and                            allergies were reviewed. The patient's tolerance of                            previous anesthesia was also reviewed. The risks                            and benefits of the procedure and the sedation                            options and risks were discussed with the patient.                            All questions were answered, and informed consent                            was obtained. Anticoagulants: The patient has taken                            aspirin. It was decided not to withhold this                            medication prior to the procedure. ASA Grade                            Assessment: II - A patient with mild systemic                            disease. After reviewing the risks and benefits,  the patient was deemed in satisfactory condition to                            undergo the procedure.                           After obtaining informed consent, the colonoscope                            was passed under direct vision. Throughout the                            procedure, the patient's blood pressure, pulse, and                            oxygen saturations were monitored continuously. The                   Colonoscope was introduced through the anus and                            advanced to the the cecum, identified by                            appendiceal orifice and ileocecal valve. The                            colonoscopy was performed without difficulty. The                            patient tolerated the procedure well. The quality                            of the bowel preparation was excellent. The                            ileocecal valve, appendiceal orifice, and rectum                            were photographed. The quality of the bowel                            preparation was evaluated using the BBPS [facility]                            Bowel Preparation Scale) with scores of: Right                            Colon = 3, Transverse Colon = 3 and Left Colon = 2.                            The total BBPS score equals 8. The bowel  preparation used was SUPREP. Scope In: 8:59:00 AM Scope Out: 9:13:44 AM Scope Withdrawal Time: 0 hours 9 minutes 59 seconds  Total Procedure Duration: 0 hours 14 minutes 44 seconds  Findings:                 The perianal and digital rectal examinations were                            normal.                           Two sessile polyps were found in the splenic                            flexure. The polyps were 1 to 2 mm in size. These                            polyps were removed with a cold biopsy forceps.                            Resection and retrieval were complete.                           A tattoo was seen in the descending colon. The                            tattoo site appeared normal. There was no polyp in                            this area.                           Internal hemorrhoids were found during retroflexion                            and during anoscopy. The hemorrhoids were small and                            Grade I (internal hemorrhoids that do not prolapse).                            The exam was otherwise without abnormality on                            direct and retroflexion views. Complications:            No immediate complications. Estimated Blood Loss:     Estimated blood loss: none. Impression:               - Two 1 to 2 mm polyps at the splenic flexure,                            removed with a cold biopsy forceps. Resected and  retrieved.                           - A tattoo was seen in the descending colon. The                            tattoo site appeared normal.                           - Internal hemorrhoids.                           - The examination was otherwise normal on direct                            and retroflexion views. Recommendation:           - Patient has a contact number available for                            emergencies. The signs and symptoms of potential                            delayed complications were discussed with the                            patient. Return to normal activities tomorrow.                            Written discharge instructions were provided to the                            patient.                           - Resume previous diet.                           - Continue present medications.                           - Await pathology results.                           - Repeat colonoscopy in 5 years for surveillance. Henry L. Loletha Carrow, MD 07/05/2016 9:18:22 AM This report has been signed electronically.

## 2016-07-05 NOTE — Progress Notes (Signed)
Called to room to assist during endoscopic procedure.  Patient ID and intended procedure confirmed with present staff. Received instructions for my participation in the procedure from the performing physician.  

## 2016-07-05 NOTE — Patient Instructions (Signed)
Impression/Recommendations:  Polyp handout given to patient. Hemorrhoid handout given to patient.  Repeat colonoscopy in 5 years.  YOU HAD AN ENDOSCOPIC PROCEDURE TODAY AT [facility]:   Refer to the procedure report that was given to you for any specific questions about what was found during the examination.  If the procedure report does not answer your questions, please call your gastroenterologist to clarify.  If you requested that your care partner not be given the details of your procedure findings, then the procedure report has been included in a sealed envelope for you to review at your convenience later.  YOU SHOULD EXPECT: Some feelings of bloating in the abdomen. Passage of more gas than usual.  Walking can help get rid of the air that was put into your GI tract during the procedure and reduce the bloating. If you had a lower endoscopy (such as a colonoscopy or flexible sigmoidoscopy) you may notice spotting of blood in your stool or on the toilet paper. If you underwent a bowel prep for your procedure, you may not have a normal bowel movement for a few days.  Please Note:  You might notice some irritation and congestion in your nose or some drainage.  This is from the oxygen used during your procedure.  There is no need for concern and it should clear up in a day or so.  SYMPTOMS TO REPORT IMMEDIATELY:   Following lower endoscopy (colonoscopy or flexible sigmoidoscopy):  Excessive amounts of blood in the stool  Significant tenderness or worsening of abdominal pains  Swelling of the abdomen that is new, acute  Fever of 100F or higher   For urgent or emergent issues, a gastroenterologist can be reached at any hour by calling [phone].   DIET:  We do recommend a small meal at first, but then you may proceed to your regular diet.  Drink plenty of fluids but you should avoid alcoholic beverages for 24 hours.  ACTIVITY:  You should plan to take it easy for the  rest of today and you should NOT DRIVE or use heavy machinery until tomorrow (because of the sedation medicines used during the test).    FOLLOW UP: Our staff will call the number listed on your records the next business day following your procedure to check on you and address any questions or concerns that you may have regarding the information given to you following your procedure. If we do not reach you, we will leave a message.  However, if you are feeling well and you are not experiencing any problems, there is no need to return our call.  We will assume that you have returned to your regular daily activities without incident.  If any biopsies were taken you will be contacted by phone or by letter within the next 1-3 weeks.  Please call us at [phone] if you have not heard about the biopsies in 3 weeks.    SIGNATURES/CONFIDENTIALITY: You and/or your care partner have signed paperwork which will be entered into your electronic medical record.  These signatures attest to the fact that that the information above on your After Visit Summary has been reviewed and is understood.  Full responsibility of the confidentiality of this discharge information lies with you and/or your care-partner.

## 2016-07-06 ENCOUNTER — Telehealth: Payer: Self-pay | Admitting: *Deleted

## 2016-07-06 NOTE — Telephone Encounter (Signed)
  Follow up Call-  Call back number 07/05/2016  Post procedure Call Back phone  # [phone]  Permission to leave phone message Yes  Some recent data might be hidden     Patient questions:  Do you have a fever, pain , or abdominal swelling? No. Pain Score  0 *  Have you tolerated food without any problems? Yes.    Have you been able to return to your normal activities? Yes.    Do you have any questions about your discharge instructions: Diet   No. Medications  No. Follow up visit  No.  Do you have questions or concerns about your Care? No.  Actions: * If pain score is 4 or above: No action needed, pain <4.

## 2016-07-11 ENCOUNTER — Encounter: Payer: Self-pay | Admitting: Gastroenterology

## 2016-11-13 ENCOUNTER — Ambulatory Visit: Payer: Self-pay

## 2016-11-23 ENCOUNTER — Other Ambulatory Visit: Payer: Self-pay | Admitting: *Deleted

## 2016-11-23 ENCOUNTER — Other Ambulatory Visit: Payer: Self-pay

## 2016-11-23 DIAGNOSIS — I10 Essential (primary) hypertension: Secondary | ICD-10-CM

## 2016-11-23 MED ORDER — OLMESARTAN MEDOXOMIL-HCTZ 20-12.5 MG PO TABS: 1.0000 | ORAL_TABLET | Freq: Every day | ORAL | 3 refills | Status: DC

## 2016-11-23 NOTE — Telephone Encounter (Signed)
Have sent a new request, do not see where he has requested before now Has not kept his appt [facility], cannot speak w/ [facility] but I do not think they carry this med, I have tried calling all their ph#

## 2016-11-23 NOTE — Telephone Encounter (Signed)
olmesartan-hydrochlorothiazide (BENICAR HCT) 20-12.5 MG tablet, refill request @ [facility] department.   Per patient the pharmacy is still waiting for the office to reply back for patient Rx. Pt would like this medication by today. Please call pt back.

## 2016-12-10 ENCOUNTER — Ambulatory Visit (INDEPENDENT_AMBULATORY_CARE_PROVIDER_SITE_OTHER): Payer: Self-pay | Admitting: Internal Medicine

## 2016-12-10 ENCOUNTER — Encounter: Payer: Self-pay | Admitting: Internal Medicine

## 2016-12-10 VITALS — BP 130/74 | HR 73 | Temp 98.3°F | Ht 66.0 in | Wt 165.7 lb

## 2016-12-10 DIAGNOSIS — I10 Essential (primary) hypertension: Secondary | ICD-10-CM

## 2016-12-10 DIAGNOSIS — E113299 Type 2 diabetes mellitus with mild nonproliferative diabetic retinopathy without macular edema, unspecified eye: Secondary | ICD-10-CM

## 2016-12-10 DIAGNOSIS — Z0000 Encounter for general adult medical examination without abnormal findings: Secondary | ICD-10-CM

## 2016-12-10 DIAGNOSIS — E119 Type 2 diabetes mellitus without complications: Secondary | ICD-10-CM

## 2016-12-10 DIAGNOSIS — N529 Male erectile dysfunction, unspecified: Secondary | ICD-10-CM

## 2016-12-10 LAB — GLUCOSE, CAPILLARY: Glucose-Capillary: 99 mg/dL (ref 65–99)

## 2016-12-10 LAB — POCT GLYCOSYLATED HEMOGLOBIN (HGB A1C): Hemoglobin A1C: 6.2

## 2016-12-10 MED ORDER — AMLODIPINE BESYLATE 10 MG PO TABS: 10.0000 mg | ORAL_TABLET | Freq: Every day | ORAL | 3 refills | Status: DC

## 2016-12-10 MED ORDER — METFORMIN HCL 1000 MG PO TABS: 500.0000 mg | ORAL_TABLET | Freq: Two times a day (BID) | ORAL | 1 refills | Status: DC

## 2016-12-10 MED ORDER — OLMESARTAN MEDOXOMIL-HCTZ 20-12.5 MG PO TABS: 1.0000 | ORAL_TABLET | Freq: Every day | ORAL | 3 refills | Status: DC

## 2016-12-10 MED ORDER — SILDENAFIL CITRATE 20 MG PO TABS
ORAL_TABLET | ORAL | 1 refills | Status: DC
Start: 2016-12-10 — End: 2017-11-18

## 2016-12-10 MED ORDER — ATORVASTATIN CALCIUM 40 MG PO TABS: 40.0000 mg | ORAL_TABLET | Freq: Every day | ORAL | 3 refills | Status: DC

## 2016-12-10 NOTE — Progress Notes (Signed)
    CC: HTN, DM, HLD, HM HPI: [name] is a 56 y.o. man with PMH noted below here for HTN, DM, HLD, HM  Please see Problem List/A&P for the status of the patient's chronic medical problems   Past Medical History:  Diagnosis Date  . Diabetes mellitus without complication ([place])   . Hyperlipidemia   . Hypertension     Review of Systems:  Constitutional: Negative for fever, chills, weight loss and malaise/fatigue.  HEENT: No headaches, vision problems, cough, hearing problems  Respiratory: Negative for cough, shortness of breath and wheezing.  Gastrointestinal: Negative for nausea, vomiting, abdominal pain, diarrhea and constipation.     Physical Exam: Vitals:   12/10/16 1433 12/10/16 1511 12/10/16 1515  BP: (!) 145/78 (!) 144/76 130/74  Pulse: 89 73   Temp: 98.3 F (36.8 C)    TempSrc: Oral    SpO2: 100%    Weight: 165 lb 11.2 oz (75.2 kg)    Height: 5\' 6"  (1.676 m)      General: A&O, in NAD Neck: supple, midline trachea,  CV: RRR, normal s1, s2, no m/r/g,  Resp: equal and symmetric breath sounds, no wheezing or crackles  Abdomen: soft, nontender, nondistended, +BS Extremities: pulses intact b/l, no edema    Assessment & Plan:   See encounters tab for problem based medical decision making. Patient discussed with Dr. Lynnae January

## 2016-12-10 NOTE — Patient Instructions (Addendum)
Thank you for your visit today Please continue working on exercising more. Please follow up in 6 months

## 2016-12-11 LAB — BMP8+ANION GAP
Anion Gap: 18 mmol/L (ref 10.0–18.0)
BUN / CREAT RATIO: 11 (ref 9–20)
BUN: 10 mg/dL (ref 6–24)
CO2: 26 mmol/L (ref 20–29)
CREATININE: 0.94 mg/dL (ref 0.76–1.27)
Calcium: 10 mg/dL (ref 8.7–10.2)
Chloride: 98 mmol/L (ref 96–106)
GFR calc non Af Amer: 91 mL/min/{1.73_m2} (ref >59)
GFR, EST AFRICAN AMERICAN: 105 mL/min/{1.73_m2} (ref >59)
Glucose: 104 mg/dL — ABNORMAL HIGH (ref 65–99)
Potassium: 3.6 mmol/L (ref 3.5–5.2)
SODIUM: 142 mmol/L (ref 134–144)

## 2016-12-11 LAB — MICROALBUMIN / CREATININE URINE RATIO
Creatinine, Urine: 24.1 mg/dL
MICROALB/CREAT RATIO: 14.9 mg/g{creat} (ref 0.0–30.0)
MICROALBUM., U, RANDOM: 3.6 ug/mL

## 2016-12-11 NOTE — Assessment & Plan Note (Signed)
Patient declined flu vaccine He declined Hep C and HIV screening Had colonoscopy earlier in year which showed 2 polyps and internal hemorrhoids- - repeat colonoscopy recommendation in 5 years

## 2016-12-11 NOTE — Assessment & Plan Note (Signed)
THis problem is chronic and stable. He denies any side effects from revatio.  Plan -refilled revatio

## 2016-12-11 NOTE — Assessment & Plan Note (Addendum)
BP Readings from Last 3 Encounters:  12/10/16 130/74  07/05/16 108/62  04/02/16 137/79   Patient's blood pressure by manual was 130/74, which is at goal.  He is compliant with his amlodipine and olmesartan-hctz.  Plan -continue amlodipine and olmesartan-hctz -obtain BMET--> shows normal renal function and electrolytes

## 2016-12-11 NOTE — Assessment & Plan Note (Signed)
Lab Results  Component Value Date   HGBA1C 6.2 12/10/2016   HGBA1C 6.3 04/02/2016   HGBA1C 6.4 11/07/2015    Patient's A1c was at goal. He is compliant with his metformin 500 mg BID  Plan -continue metformin 500 mg BID -obtain urine microalbumin today  -on statin, aspirin -up to date on foot and eye exam -follow up in 6 months

## 2016-12-12 NOTE — Progress Notes (Signed)
Internal Medicine Clinic Attending  Case discussed with Dr. Saraiya at the time of the visit.  We reviewed the resident's history and exam and pertinent patient test results.  I agree with the assessment, diagnosis, and plan of care documented in the resident's note.  

## 2017-06-27 ENCOUNTER — Telehealth: Payer: Self-pay | Admitting: *Deleted

## 2017-06-27 NOTE — Telephone Encounter (Signed)
Fax from [facility] is no longer available for free. Wants to know if u are willing to change to [facility] - which can be obtain for free. Or pt can get Losartan/HCTZ for a reduced price. Please send new rx if appropriate.

## 2017-06-28 ENCOUNTER — Other Ambulatory Visit: Payer: Self-pay | Admitting: Internal Medicine

## 2017-06-28 DIAGNOSIS — I10 Essential (primary) hypertension: Secondary | ICD-10-CM

## 2017-06-28 MED ORDER — AZILSARTAN-CHLORTHALIDONE 40-12.5 MG PO TABS
1.0000 | ORAL_TABLET | Freq: Every day | ORAL | 3 refills | Status: DC
Start: 2017-06-28 — End: 2018-07-24

## 2017-06-28 NOTE — Progress Notes (Signed)
Contacted by patient pharmacy and informed that Omelsartan-HCTZ Nurse, mental health) is no long affordable for patient. Alternative of Azilsartan-Chlorthalidone is available for free. I have discontinued current Benicar and ordered Azilsartan-Chlorthalidone (Edarbyclor) at equivalent dose. Will check BP at scheduled follow up and review change with patient at that time.  Pearson Grippe, DO IM PGY-1

## 2017-06-28 NOTE — Telephone Encounter (Signed)
Patients prescription has been changed to equivalent dose of Edarbyclor. Thank you.

## 2017-07-11 ENCOUNTER — Ambulatory Visit (INDEPENDENT_AMBULATORY_CARE_PROVIDER_SITE_OTHER): Payer: Self-pay | Admitting: Internal Medicine

## 2017-07-11 ENCOUNTER — Encounter: Payer: Self-pay | Admitting: Internal Medicine

## 2017-07-11 ENCOUNTER — Other Ambulatory Visit: Payer: Self-pay

## 2017-07-11 ENCOUNTER — Encounter (INDEPENDENT_AMBULATORY_CARE_PROVIDER_SITE_OTHER): Payer: Self-pay

## 2017-07-11 VITALS — BP 125/75 | HR 80 | Temp 98.7°F | Wt 174.1 lb

## 2017-07-11 DIAGNOSIS — Z86010 Personal history of colonic polyps: Secondary | ICD-10-CM

## 2017-07-11 DIAGNOSIS — D126 Benign neoplasm of colon, unspecified: Secondary | ICD-10-CM

## 2017-07-11 DIAGNOSIS — I10 Essential (primary) hypertension: Secondary | ICD-10-CM

## 2017-07-11 DIAGNOSIS — Z0000 Encounter for general adult medical examination without abnormal findings: Secondary | ICD-10-CM

## 2017-07-11 DIAGNOSIS — Z7982 Long term (current) use of aspirin: Secondary | ICD-10-CM

## 2017-07-11 DIAGNOSIS — E7800 Pure hypercholesterolemia, unspecified: Secondary | ICD-10-CM

## 2017-07-11 DIAGNOSIS — E113299 Type 2 diabetes mellitus with mild nonproliferative diabetic retinopathy without macular edema, unspecified eye: Secondary | ICD-10-CM

## 2017-07-11 DIAGNOSIS — Z7984 Long term (current) use of oral hypoglycemic drugs: Secondary | ICD-10-CM

## 2017-07-11 DIAGNOSIS — E119 Type 2 diabetes mellitus without complications: Secondary | ICD-10-CM

## 2017-07-11 DIAGNOSIS — Z79899 Other long term (current) drug therapy: Secondary | ICD-10-CM

## 2017-07-11 LAB — GLUCOSE, CAPILLARY: Glucose-Capillary: 134 mg/dL — ABNORMAL HIGH (ref 65–99)

## 2017-07-11 LAB — POCT GLYCOSYLATED HEMOGLOBIN (HGB A1C): HEMOGLOBIN A1C: 7

## 2017-07-11 MED ORDER — ATORVASTATIN CALCIUM 40 MG PO TABS: 40.0000 mg | ORAL_TABLET | Freq: Every day | ORAL | 3 refills | Status: DC

## 2017-07-11 MED ORDER — METFORMIN HCL 1000 MG PO TABS: 500.0000 mg | ORAL_TABLET | Freq: Two times a day (BID) | ORAL | 1 refills | Status: DC

## 2017-07-11 MED ORDER — AMLODIPINE BESYLATE 10 MG PO TABS: 10.0000 mg | ORAL_TABLET | Freq: Every day | ORAL | 3 refills | Status: DC

## 2017-07-11 NOTE — Patient Instructions (Addendum)
Thank you for allowing [place] to care for you  Your Blood pressure remains well controlled, but we will be switching one of your medications from Olmesartan-HCTZ to Azilsartan-Chlorthalidone 40-12.5mg . Continue to monitor this at home.   Your A1c is elevated today at 7.0, continue to work on your diet and we will recheck next visit.  You were provided with medication refills at this visit  Please schedule an eye exam appointment with Butch Penny  Please return for follow up in 3 months

## 2017-07-11 NOTE — Assessment & Plan Note (Addendum)
BP today 125/75. Last visit (12/10/16) BP 130/74. Patient has remained compliant with medications, but due to his previous combination pill no longer being available for free through [facility] Department patient we will be switching him from Olmesartan-HCTZ to Azilsartan-Chlorthalidone 40-12.5mg  for next refill. - Amlodipine 10 Daily - Azilsartan-Chlorthalidone 40-12.5mg  Daily  - Monitor Blood pressure at [facility] to evaluate effectiveness of new medication - Continue lifestyle modifications

## 2017-07-11 NOTE — Progress Notes (Signed)
   CC: Hypertension, Diabetes, Hyperlipidemia, Healthcare maintenance  HPI:  [name] is a 57 y.o. M with PMHx listed below presenting for Hypertension, Diabetes, Hyperlipidemia, Healthcare maintenance. Please see the A&P for the status of the patient's chronic medical problems.  Past Medical History:  Diagnosis Date  . Diabetes mellitus without complication ([place])   . Hyperlipidemia   . Hypertension    Review of Systems:  Performed and all others negative.  Physical Exam:   Vitals:   07/11/17 1333  BP: 125/75  Pulse: 80  Temp: 98.7 F (37.1 C)  TempSrc: Oral  SpO2: 100%  Weight: 174 lb 1.6 oz (79 kg)   Physical Exam  Constitutional: He is oriented to person, place, and time. He appears well-developed and well-nourished. No distress.  Cardiovascular: Normal rate, regular rhythm, normal heart sounds and intact distal pulses.  Pulmonary/Chest: Effort normal and breath sounds normal. No respiratory distress.  Abdominal: Soft. Bowel sounds are normal. He exhibits no distension. There is no tenderness.  Musculoskeletal: He exhibits no edema or deformity.  Neurological: He is alert and oriented to person, place, and time.   Assessment & Plan:   See Encounters Tab for problem based charting.  Patient discussed with Dr. Dareen Warner

## 2017-07-12 ENCOUNTER — Encounter: Payer: Self-pay | Admitting: Internal Medicine

## 2017-07-12 ENCOUNTER — Telehealth: Payer: Self-pay | Admitting: Dietician

## 2017-07-12 NOTE — Assessment & Plan Note (Signed)
History of Colon Polyps. He had his repeat Colonoscopy performed on 07/05/2016 showed two 1 to 2 mm polyps at the splenic flexure, which were removed and shown to be tubular adenomas on pathology, no high grade dysplasia or malignancy - Repeat Colonoscopy 5 years from previous,~ May 2023

## 2017-07-12 NOTE — Assessment & Plan Note (Signed)
Last Lipid panel on 04/03/2016 was WNL. Will continue patient on current therapy (high intensity statin). - Atorvastatin 40mg  Daily

## 2017-07-12 NOTE — Assessment & Plan Note (Signed)
A1c Today 7.0, which increased from previous of 6.2. Patient states he has not been doing as well with his diet for the past few months and would like to try working on that before changing his medications. Currently taking only Metformin 500mg  BID, so there are many options for advancement of therapy in the future if needed. - Metformin 500mg  BID - Continue lifestyle modifications - Foot exam today - Eye exam in the clinic at next visit

## 2017-07-12 NOTE — Assessment & Plan Note (Signed)
Patient has been on ASA 81 daily for primary prevention for many years. Based on recent studies ASA is no longer recommended for primary prevention, will discontinue at this time.

## 2017-07-12 NOTE — Telephone Encounter (Signed)
Appointment scheduled for July 30, 2017 for retinal exam/images.

## 2017-07-15 NOTE — Progress Notes (Signed)
Internal Medicine Clinic Attending  Case discussed with Dr. Melvin  at the time of the visit.  We reviewed the resident's history and exam and pertinent patient test results.  I agree with the assessment, diagnosis, and plan of care documented in the resident's note.  

## 2017-07-30 ENCOUNTER — Ambulatory Visit: Payer: Self-pay | Admitting: Dietician

## 2017-08-20 ENCOUNTER — Other Ambulatory Visit: Payer: Self-pay | Admitting: Dietician

## 2017-08-20 ENCOUNTER — Ambulatory Visit: Payer: Self-pay | Admitting: Dietician

## 2017-08-20 ENCOUNTER — Encounter: Payer: Self-pay | Admitting: Dietician

## 2017-08-20 DIAGNOSIS — E113299 Type 2 diabetes mellitus with mild nonproliferative diabetic retinopathy without macular edema, unspecified eye: Secondary | ICD-10-CM

## 2017-08-20 LAB — HM DIABETES EYE EXAM

## 2017-08-20 NOTE — Progress Notes (Signed)
Retinal images done and transmitted today 

## 2017-08-26 ENCOUNTER — Encounter: Payer: Self-pay | Admitting: Dietician

## 2017-11-18 ENCOUNTER — Other Ambulatory Visit: Payer: Self-pay | Admitting: *Deleted

## 2017-11-18 MED ORDER — SILDENAFIL CITRATE 20 MG PO TABS: ORAL_TABLET | ORAL | 1 refills | Status: DC

## 2017-11-18 NOTE — Telephone Encounter (Signed)
Refill approved.

## 2017-12-16 ENCOUNTER — Ambulatory Visit: Payer: Self-pay

## 2017-12-23 ENCOUNTER — Ambulatory Visit: Payer: Self-pay

## 2017-12-26 ENCOUNTER — Encounter: Payer: Self-pay | Admitting: Internal Medicine

## 2018-02-11 ENCOUNTER — Ambulatory Visit (INDEPENDENT_AMBULATORY_CARE_PROVIDER_SITE_OTHER): Payer: Self-pay | Admitting: Internal Medicine

## 2018-02-11 ENCOUNTER — Encounter: Payer: Self-pay | Admitting: Internal Medicine

## 2018-02-11 ENCOUNTER — Other Ambulatory Visit: Payer: Self-pay

## 2018-02-11 VITALS — BP 135/80 | HR 78 | Temp 98.6°F | Ht 66.0 in | Wt 174.5 lb

## 2018-02-11 DIAGNOSIS — Z23 Encounter for immunization: Secondary | ICD-10-CM

## 2018-02-11 DIAGNOSIS — N529 Male erectile dysfunction, unspecified: Secondary | ICD-10-CM

## 2018-02-11 DIAGNOSIS — E119 Type 2 diabetes mellitus without complications: Secondary | ICD-10-CM

## 2018-02-11 DIAGNOSIS — I10 Essential (primary) hypertension: Secondary | ICD-10-CM

## 2018-02-11 DIAGNOSIS — Z0000 Encounter for general adult medical examination without abnormal findings: Secondary | ICD-10-CM

## 2018-02-11 DIAGNOSIS — Z1159 Encounter for screening for other viral diseases: Secondary | ICD-10-CM

## 2018-02-11 DIAGNOSIS — E113299 Type 2 diabetes mellitus with mild nonproliferative diabetic retinopathy without macular edema, unspecified eye: Secondary | ICD-10-CM

## 2018-02-11 DIAGNOSIS — Z114 Encounter for screening for human immunodeficiency virus [HIV]: Secondary | ICD-10-CM

## 2018-02-11 DIAGNOSIS — Z7984 Long term (current) use of oral hypoglycemic drugs: Secondary | ICD-10-CM

## 2018-02-11 DIAGNOSIS — Z79899 Other long term (current) drug therapy: Secondary | ICD-10-CM

## 2018-02-11 HISTORY — DX: Encounter for immunization: Z23

## 2018-02-11 LAB — POCT GLYCOSYLATED HEMOGLOBIN (HGB A1C): HEMOGLOBIN A1C: 6.6 % — AB (ref 4.0–5.6)

## 2018-02-11 LAB — GLUCOSE, CAPILLARY: GLUCOSE-CAPILLARY: 103 mg/dL — AB (ref 70–99)

## 2018-02-11 MED ORDER — METFORMIN HCL 1000 MG PO TABS: 500.0000 mg | ORAL_TABLET | Freq: Two times a day (BID) | ORAL | 1 refills | Status: DC

## 2018-02-11 MED ORDER — SILDENAFIL CITRATE 20 MG PO TABS: ORAL_TABLET | ORAL | 1 refills | Status: DC

## 2018-02-11 NOTE — Progress Notes (Signed)
   CC: Diabetes, Hypertension, Healthcare Maintenance  HPI:  [name] is a 57 y.o. M with PMHx listed below presenting for Diabetes, Hypertension, Healthcare Maintenance. Please see the A&P for the status of the patient's chronic medical problems.   Past Medical History:  Diagnosis Date  . Diabetes mellitus without complication ([place])   . Hyperlipidemia   . Hypertension    Review of Systems: Performed and all others negative.  Physical Exam:   Vitals:   02/11/18 1438 02/11/18 1507  BP: (!) 146/67 135/80  Pulse: 83 78  Temp: 98.6 F (37 C)   TempSrc: Oral   SpO2: 98% 100%  Weight: 174 lb 8 oz (79.2 kg)   Height: 5\' 6"  (1.676 m)    Physical Exam Constitutional:      General: He is not in acute distress.    Appearance: He is well-developed.  Cardiovascular:     Rate and Rhythm: Normal rate and regular rhythm.     Heart sounds: Normal heart sounds.  Pulmonary:     Effort: Pulmonary effort is normal. No respiratory distress.     Breath sounds: Normal breath sounds.  Abdominal:     General: Bowel sounds are normal. There is no distension.     Palpations: Abdomen is soft.     Tenderness: There is no abdominal tenderness.  Musculoskeletal:        General: No deformity.  Neurological:     Mental Status: He is alert and oriented to person, place, and time.    Assessment & Plan:   See Encounters Tab for problem based charting.  Patient discussed with Dr. Angelia Mould

## 2018-02-11 NOTE — Patient Instructions (Addendum)
Thank you for allowing [place] toc are for you  For your Diabetes - A1c improving to 6.6 today - Continue metformin 500mg  twice a day - Follow up in 3 months  For you high blood pressure - BP okay today, higher than previous - Continue current medications  Refills provided today  TDAP Vaccine given today  Blood work drawn today, you will be contacted with the results  Please follow up in about 3 months

## 2018-02-11 NOTE — Assessment & Plan Note (Addendum)
BP today 146/67 and 135/80 on repeat. This is increased from previous from previous of 125/75. At his last visit his Olmesartan-HCTZ was switched to Azilsartan-Chlorthalidone 40-12.5mg  due to cost concerns as he gets his medication from the health department and the previous medication was no longer covered. He has tolerated the new combination well. Although his measurements are a little above goal in the office today, he does report checking his BP at the [facility] and having measurements in the 025G Systolic. Will continue on current regimen at this time. - Amlodipine 10 Daily - Azilsartan-Chlorthalidone 40-12.5mg  Daily  - Continue to Monitor Blood pressure at [facility] - Continue lifestyle modifications - BMP  ADDENDUM: BMP showed normal renal function and mildly low K at 3.3. Will continue to monitor. Patient sent letter with results.

## 2018-02-11 NOTE — Assessment & Plan Note (Addendum)
A1c today 6.6. This is Improved from previous of 7.0 in May. He states he would like to continue to work on diet and exercise before changing his medication doses as he has already made improvements. He is willing to make medication adjustments if A1c begin to elevate. - Metformin 500mg  BID - Continue lifestyle modifications

## 2018-02-12 ENCOUNTER — Encounter: Payer: Self-pay | Admitting: Internal Medicine

## 2018-02-12 LAB — BMP8+ANION GAP
Anion Gap: 19 mmol/L — ABNORMAL HIGH (ref 10.0–18.0)
BUN / CREAT RATIO: 11 (ref 9–20)
BUN: 11 mg/dL (ref 6–24)
CO2: 25 mmol/L (ref 20–29)
CREATININE: 1.04 mg/dL (ref 0.76–1.27)
Calcium: 9.7 mg/dL (ref 8.7–10.2)
Chloride: 95 mmol/L — ABNORMAL LOW (ref 96–106)
GFR calc Af Amer: 92 mL/min/{1.73_m2} (ref >59)
GFR, EST NON AFRICAN AMERICAN: 80 mL/min/{1.73_m2} (ref >59)
Glucose: 97 mg/dL (ref 65–99)
Potassium: 3.3 mmol/L — ABNORMAL LOW (ref 3.5–5.2)
Sodium: 139 mmol/L (ref 134–144)

## 2018-02-12 LAB — CBC
HEMATOCRIT: 38.6 % (ref 37.5–51.0)
Hemoglobin: 13.8 g/dL (ref 13.0–17.7)
MCH: 30.7 pg (ref 26.6–33.0)
MCHC: 35.8 g/dL — ABNORMAL HIGH (ref 31.5–35.7)
MCV: 86 fL (ref 79–97)
Platelets: 240 10*3/uL (ref 150–450)
RBC: 4.49 x10E6/uL (ref 4.14–5.80)
RDW: 13.1 % (ref 12.3–15.4)
WBC: 7.7 10*3/uL (ref 3.4–10.8)

## 2018-02-12 LAB — HIV ANTIBODY (ROUTINE TESTING W REFLEX): HIV Screen 4th Generation wRfx: NONREACTIVE

## 2018-02-12 LAB — HEPATITIS C ANTIBODY: Hep C Virus Ab: <0.1 s/co ratio (ref 0.0–0.9)

## 2018-02-12 NOTE — Progress Notes (Signed)
Patient sent a letter informing him of his test results. CBC WNL, BMP with mild hypokalemia at 3.3. HIV and Hep C screens negative.

## 2018-02-12 NOTE — Assessment & Plan Note (Signed)
This problem is chronic and stable. He is requesting refills today. He denies side effects from his medication, refill provided - Sildenafil 20mg  tabs, take 5 PRN

## 2018-02-12 NOTE — Assessment & Plan Note (Addendum)
-   Declines Flu shot - TDaP given - Hep C and HIV screening today - CBC and BMP today  ADDENDUM: CBC WNL, Hep C Negative, HIV Negative, BMP with normal renal function and mildly low K at 3.3. Patient sent letter with results.

## 2018-02-12 NOTE — Progress Notes (Signed)
Internal Medicine Clinic Attending  Case discussed with Dr. Melvin  at the time of the visit.  We reviewed the resident's history and exam and pertinent patient test results.  I agree with the assessment, diagnosis, and plan of care documented in the resident's note.  

## 2018-06-18 ENCOUNTER — Other Ambulatory Visit: Payer: Self-pay | Admitting: Internal Medicine

## 2018-06-18 DIAGNOSIS — I10 Essential (primary) hypertension: Secondary | ICD-10-CM

## 2018-06-18 DIAGNOSIS — E7800 Pure hypercholesterolemia, unspecified: Secondary | ICD-10-CM

## 2018-06-18 DIAGNOSIS — E113299 Type 2 diabetes mellitus with mild nonproliferative diabetic retinopathy without macular edema, unspecified eye: Secondary | ICD-10-CM

## 2018-06-18 MED ORDER — ATORVASTATIN CALCIUM 40 MG PO TABS: 40.0000 mg | ORAL_TABLET | Freq: Every day | ORAL | 3 refills | Status: DC

## 2018-06-18 MED ORDER — METFORMIN HCL 1000 MG PO TABS: 500.0000 mg | ORAL_TABLET | Freq: Two times a day (BID) | ORAL | 1 refills | Status: DC

## 2018-06-18 MED ORDER — AMLODIPINE BESYLATE 10 MG PO TABS: 10.0000 mg | ORAL_TABLET | Freq: Every day | ORAL | 3 refills | Status: DC

## 2018-06-18 NOTE — Telephone Encounter (Signed)
Needs refill on amLODipine (NORVASC) 10 MG tablet  atorvastatin (LIPITOR) 40 MG tablet metFORMIN (GLUCOPHAGE) 1000 MG tablet  ;pt contact [place] [place] [facility] [place]  Please explain to pharmacy [facility] extend additional 30days due to COVID-19

## 2018-07-23 ENCOUNTER — Other Ambulatory Visit: Payer: Self-pay | Admitting: Internal Medicine

## 2018-07-23 DIAGNOSIS — I10 Essential (primary) hypertension: Secondary | ICD-10-CM

## 2018-07-24 NOTE — Telephone Encounter (Signed)
Refill approved.

## 2018-07-30 ENCOUNTER — Other Ambulatory Visit: Payer: Self-pay | Admitting: Internal Medicine

## 2018-07-30 DIAGNOSIS — I10 Essential (primary) hypertension: Secondary | ICD-10-CM

## 2018-07-31 NOTE — Telephone Encounter (Signed)
Refill approved.

## 2018-09-17 ENCOUNTER — Ambulatory Visit: Payer: Self-pay

## 2018-09-18 ENCOUNTER — Ambulatory Visit (INDEPENDENT_AMBULATORY_CARE_PROVIDER_SITE_OTHER): Payer: Self-pay | Admitting: Internal Medicine

## 2018-09-18 ENCOUNTER — Encounter: Payer: Self-pay | Admitting: Internal Medicine

## 2018-09-18 ENCOUNTER — Other Ambulatory Visit: Payer: Self-pay

## 2018-09-18 VITALS — BP 136/69 | HR 72 | Temp 98.1°F | Ht 66.0 in | Wt 177.5 lb

## 2018-09-18 DIAGNOSIS — E7800 Pure hypercholesterolemia, unspecified: Secondary | ICD-10-CM

## 2018-09-18 DIAGNOSIS — Z79899 Other long term (current) drug therapy: Secondary | ICD-10-CM

## 2018-09-18 DIAGNOSIS — Z7984 Long term (current) use of oral hypoglycemic drugs: Secondary | ICD-10-CM

## 2018-09-18 DIAGNOSIS — E119 Type 2 diabetes mellitus without complications: Secondary | ICD-10-CM

## 2018-09-18 DIAGNOSIS — I10 Essential (primary) hypertension: Secondary | ICD-10-CM

## 2018-09-18 DIAGNOSIS — E113299 Type 2 diabetes mellitus with mild nonproliferative diabetic retinopathy without macular edema, unspecified eye: Secondary | ICD-10-CM

## 2018-09-18 LAB — GLUCOSE, CAPILLARY: Glucose-Capillary: 126 mg/dL — ABNORMAL HIGH (ref 70–99)

## 2018-09-18 LAB — POCT GLYCOSYLATED HEMOGLOBIN (HGB A1C): Hemoglobin A1C: 7.3 % — AB (ref 4.0–5.6)

## 2018-09-18 MED ORDER — METFORMIN HCL 1000 MG PO TABS: 500.0000 mg | ORAL_TABLET | Freq: Two times a day (BID) | ORAL | 1 refills | Status: DC

## 2018-09-18 MED ORDER — ATORVASTATIN CALCIUM 40 MG PO TABS: 40.0000 mg | ORAL_TABLET | Freq: Every day | ORAL | 3 refills | Status: DC

## 2018-09-18 MED ORDER — AMLODIPINE BESYLATE 10 MG PO TABS: 10.0000 mg | ORAL_TABLET | Freq: Every day | ORAL | 3 refills | Status: DC

## 2018-09-18 MED ORDER — EDARBYCLOR 40-12.5 MG PO TABS: 1.0000 | ORAL_TABLET | Freq: Every day | ORAL | 3 refills | Status: DC

## 2018-09-18 NOTE — Assessment & Plan Note (Signed)
BP today 136/69. This is stable for him. He continues to have good control on current medications. Will provide refills and continue to monitor. He will be due for repeat BMP at next follow up. - Amlodipine 10mg  Daily - Azilsartan-Chlorthalidone 40-12.5mg  Daily - Continue diet and exercise - Follow up in 3 months, BMP at that time

## 2018-09-18 NOTE — Progress Notes (Signed)
   CC: Hypertension, Diabetes  HPI:  [name] is a 58 y.o. M with PMHx listed below presenting for Hypertension, Diabetes. Please see the A&P for the status of the patient's chronic medical problems.   Past Medical History:  Diagnosis Date  . Diabetes mellitus without complication ([place])   . Hyperlipidemia   . Hypertension    Review of Systems:  Performed and all others negative.  Physical Exam:  Vitals:   09/18/18 1509  BP: 136/69  Pulse: 72  Temp: 98.1 F (36.7 C)  TempSrc: Oral  SpO2: 100%  Weight: 177 lb 8 oz (80.5 kg)  Height: 5\' 6"  (1.676 m)   Physical Exam Constitutional:      General: He is not in acute distress.    Appearance: Normal appearance.  Cardiovascular:     Rate and Rhythm: Normal rate and regular rhythm.     Pulses: Normal pulses.     Heart sounds: Normal heart sounds.  Pulmonary:     Effort: Pulmonary effort is normal. No respiratory distress.     Breath sounds: Normal breath sounds.  Abdominal:     General: Bowel sounds are normal. There is no distension.     Palpations: Abdomen is soft.     Tenderness: There is no abdominal tenderness.  Musculoskeletal:        General: No swelling or deformity.  Skin:    General: Skin is warm and dry.  Neurological:     General: No focal deficit present.     Mental Status: Mental status is at baseline.    Assessment & Plan:   See Encounters Tab for problem based charting.  Patient discussed with Dr. Dareen Piano

## 2018-09-18 NOTE — Assessment & Plan Note (Addendum)
A1c 7.3 today, increased from previous of 6.6, He attributes this to not being bale to access the [employer] during the Pandemic and increased snacking. He plans to get back to exercise and would like to defer increasing metformin dose until follow up to see if he can improve with lifestyle modifications. - Metformin 500mg  BID, will increase at follow up if A1c not improved - Lifestyle modifications - Foot Exam today - Eye exam here in 3 months if being offered - Follow up in about 3 months

## 2018-09-18 NOTE — Patient Instructions (Addendum)
Thank you for allowing [place] to care for you  For your Diabetes - A1c is up today to 7.3 - Work on diet and exercise - Continue current medications, we will make changes next visit if A1c in not improved - Foot Exam today - Follow up in 3 months, will hopefully do eye exam at that time  For your High blood pressure - This is well controlled - Continue current medications  Please follow up in about 3 months

## 2018-09-22 NOTE — Progress Notes (Signed)
Internal Medicine Clinic Attending  Case discussed with Dr. Melvin  at the time of the visit.  We reviewed the resident's history and exam and pertinent patient test results.  I agree with the assessment, diagnosis, and plan of care documented in the resident's note.  

## 2019-02-02 ENCOUNTER — Encounter: Payer: Self-pay | Admitting: Internal Medicine

## 2019-02-02 ENCOUNTER — Other Ambulatory Visit: Payer: Self-pay

## 2019-02-02 ENCOUNTER — Ambulatory Visit (INDEPENDENT_AMBULATORY_CARE_PROVIDER_SITE_OTHER): Payer: Self-pay | Admitting: Internal Medicine

## 2019-02-02 VITALS — BP 129/71 | HR 87 | Temp 98.9°F | Ht 66.0 in | Wt 171.6 lb

## 2019-02-02 DIAGNOSIS — N529 Male erectile dysfunction, unspecified: Secondary | ICD-10-CM

## 2019-02-02 DIAGNOSIS — I10 Essential (primary) hypertension: Secondary | ICD-10-CM

## 2019-02-02 DIAGNOSIS — E7800 Pure hypercholesterolemia, unspecified: Secondary | ICD-10-CM

## 2019-02-02 DIAGNOSIS — Z79899 Other long term (current) drug therapy: Secondary | ICD-10-CM

## 2019-02-02 DIAGNOSIS — E119 Type 2 diabetes mellitus without complications: Secondary | ICD-10-CM

## 2019-02-02 DIAGNOSIS — E113299 Type 2 diabetes mellitus with mild nonproliferative diabetic retinopathy without macular edema, unspecified eye: Secondary | ICD-10-CM

## 2019-02-02 DIAGNOSIS — Z0000 Encounter for general adult medical examination without abnormal findings: Secondary | ICD-10-CM

## 2019-02-02 DIAGNOSIS — Z7984 Long term (current) use of oral hypoglycemic drugs: Secondary | ICD-10-CM

## 2019-02-02 LAB — POCT GLYCOSYLATED HEMOGLOBIN (HGB A1C): Hemoglobin A1C: 6.8 % — AB (ref 4.0–5.6)

## 2019-02-02 LAB — GLUCOSE, CAPILLARY: Glucose-Capillary: 112 mg/dL — ABNORMAL HIGH (ref 70–99)

## 2019-02-02 MED ORDER — METFORMIN HCL 1000 MG PO TABS: 500.0000 mg | ORAL_TABLET | Freq: Two times a day (BID) | ORAL | 1 refills | Status: DC

## 2019-02-02 MED ORDER — EDARBYCLOR 40-12.5 MG PO TABS: 1.0000 | ORAL_TABLET | Freq: Every day | ORAL | 3 refills | Status: DC

## 2019-02-02 MED ORDER — AMLODIPINE BESYLATE 10 MG PO TABS: 10.0000 mg | ORAL_TABLET | Freq: Every day | ORAL | 3 refills | Status: DC

## 2019-02-02 MED ORDER — SILDENAFIL CITRATE 20 MG PO TABS: ORAL_TABLET | ORAL | 1 refills | Status: DC

## 2019-02-02 MED ORDER — ATORVASTATIN CALCIUM 40 MG PO TABS: 40.0000 mg | ORAL_TABLET | Freq: Every day | ORAL | 3 refills | Status: DC

## 2019-02-02 NOTE — Assessment & Plan Note (Addendum)
-   Flu vaccine decline - Eye exam deferred due to cost

## 2019-02-02 NOTE — Progress Notes (Signed)
   CC: Hypertension, Diabetes, Hyperlipidemia, ED  HPI:  [name] is a 58 y.o. M with PMHx listed below presenting for Hypertension, Diabetes, Hyperlipidemia, ED. Please see the A&P for the status of the patient's chronic medical problems.  Past Medical History:  Diagnosis Date  . Diabetes mellitus without complication ([place])   . Hyperlipidemia   . Hypertension   . Need for diphtheria-tetanus-pertussis (Tdap) vaccine 02/11/2018   Review of Systems:  Performed and all others negative.  Physical Exam:  Vitals:   02/02/19 0959  BP: 129/71  Pulse: 87  Temp: 98.9 F (37.2 C)  TempSrc: Oral  SpO2: 100%  Weight: 171 lb 9.6 oz (77.8 kg)  Height: 5\' 6"  (1.676 m)   Physical Exam Constitutional:      General: He is not in acute distress.    Appearance: Normal appearance.  Cardiovascular:     Rate and Rhythm: Normal rate and regular rhythm.     Pulses: Normal pulses.     Heart sounds: Normal heart sounds.  Pulmonary:     Effort: Pulmonary effort is normal. No respiratory distress.     Breath sounds: Normal breath sounds.  Abdominal:     General: Bowel sounds are normal. There is no distension.     Palpations: Abdomen is soft.     Tenderness: There is no abdominal tenderness.  Musculoskeletal:        General: No swelling or deformity.  Skin:    General: Skin is warm and dry.  Neurological:     General: No focal deficit present.     Mental Status: Mental status is at baseline.    Assessment & Plan:   See Encounters Tab for problem based charting.  Patient discussed with Dr. Heber Citrus

## 2019-02-02 NOTE — Patient Instructions (Addendum)
Thank you for allowing [place] to care for you  For your HTN - BP well controlled, continue current medications  For your Diabetes - A1c improved to 6.8 - Continue current medications  Refills provided today  Follow up in about 3 months

## 2019-02-02 NOTE — Assessment & Plan Note (Addendum)
BP remains controlled today at 129/71. Will continue current regimen and check BMP. - Amlodipine 10mg  Daily - Azilsartan-Chlorthalidone 40-12.5mg  Daily - Continue diet and exercise - BMP

## 2019-02-02 NOTE — Assessment & Plan Note (Addendum)
A1c is 6.8 today, improved from previous of 7.3 and well controlled. Will continue current regimen and follow up in 6 months. - Metformin 500mg  BID - Eye exam deferred due to cost - 6 month follow up

## 2019-02-02 NOTE — Progress Notes (Signed)
Duplicate note entered in error

## 2019-02-02 NOTE — Assessment & Plan Note (Signed)
Patient requests refills today. Which were provided. - Sildenafil 20mg , take 5 PRN

## 2019-02-02 NOTE — Assessment & Plan Note (Signed)
Patient request refill of atorvastatin 40mg  Daily, which was provided.

## 2019-02-03 ENCOUNTER — Other Ambulatory Visit: Payer: Self-pay | Admitting: Internal Medicine

## 2019-02-03 NOTE — Telephone Encounter (Signed)
[facility], [facility] not [facility], called pt told [facility] to [facility] [place], he was agreeable

## 2019-02-03 NOTE — Telephone Encounter (Signed)
[facility] that [facility]'s too [facility], pls [facility] to [facility] on [facility] sildenafil (REVATIO) 20 MG tablet Pt contact [phone]

## 2019-02-04 NOTE — Progress Notes (Signed)
Internal Medicine Clinic Attending  Case discussed with Dr. Melvin  at the time of the visit.  We reviewed the resident's history and exam and pertinent patient test results.  I agree with the assessment, diagnosis, and plan of care documented in the resident's note.  

## 2019-04-06 ENCOUNTER — Ambulatory Visit: Payer: Self-pay

## 2019-06-26 ENCOUNTER — Encounter: Payer: Self-pay | Admitting: *Deleted

## 2020-08-10 ENCOUNTER — Encounter: Payer: Self-pay | Admitting: *Deleted

## 2021-02-07 LAB — HM DIABETES EYE EXAM

## 2021-05-08 ENCOUNTER — Ambulatory Visit: Payer: Self-pay | Admitting: Student

## 2021-08-09 ENCOUNTER — Ambulatory Visit (INDEPENDENT_AMBULATORY_CARE_PROVIDER_SITE_OTHER): Payer: 59 | Admitting: Internal Medicine

## 2021-08-09 ENCOUNTER — Encounter: Payer: Self-pay | Admitting: Internal Medicine

## 2021-08-09 ENCOUNTER — Other Ambulatory Visit: Payer: Self-pay

## 2021-08-09 VITALS — BP 138/73 | HR 94 | Temp 97.9°F | Ht 66.0 in | Wt 171.3 lb

## 2021-08-09 DIAGNOSIS — Z7984 Long term (current) use of oral hypoglycemic drugs: Secondary | ICD-10-CM

## 2021-08-09 DIAGNOSIS — E7800 Pure hypercholesterolemia, unspecified: Secondary | ICD-10-CM | POA: Diagnosis not present

## 2021-08-09 DIAGNOSIS — N529 Male erectile dysfunction, unspecified: Secondary | ICD-10-CM

## 2021-08-09 DIAGNOSIS — I10 Essential (primary) hypertension: Secondary | ICD-10-CM | POA: Diagnosis not present

## 2021-08-09 DIAGNOSIS — E119 Type 2 diabetes mellitus without complications: Secondary | ICD-10-CM | POA: Diagnosis not present

## 2021-08-09 DIAGNOSIS — E113299 Type 2 diabetes mellitus with mild nonproliferative diabetic retinopathy without macular edema, unspecified eye: Secondary | ICD-10-CM

## 2021-08-09 DIAGNOSIS — E876 Hypokalemia: Secondary | ICD-10-CM

## 2021-08-09 LAB — POCT GLYCOSYLATED HEMOGLOBIN (HGB A1C): Hemoglobin A1C: 8 % — AB (ref 4.0–5.6)

## 2021-08-09 LAB — GLUCOSE, CAPILLARY: Glucose-Capillary: 118 mg/dL — ABNORMAL HIGH (ref 70–99)

## 2021-08-09 MED ORDER — RYBELSUS 3 MG PO TABS: 3.0000 mg | ORAL_TABLET | Freq: Every day | ORAL | 0 refills | Status: DC

## 2021-08-09 MED ORDER — RYBELSUS 3 MG PO TABS: 3.0000 mg | ORAL_TABLET | Freq: Every day | ORAL | 3 refills | Status: DC

## 2021-08-09 MED ORDER — RYBELSUS 7 MG PO TABS: 7.0000 mg | ORAL_TABLET | Freq: Every day | ORAL | 2 refills | Status: DC

## 2021-08-09 MED ORDER — SILDENAFIL CITRATE 20 MG PO TABS: ORAL_TABLET | ORAL | 1 refills | Status: AC

## 2021-08-09 NOTE — Assessment & Plan Note (Signed)
Patient has been followed by [facility] IM. Last seen in Nov 2022, at which time he was started on glimepiride along with his metformin. He states that he has been compliant with metformin but has not started glimepiride.   A1c of 8 today.  Plan: Will continue metformin 1000 mg BID. Will DC glimepiride and start rybelsus given benefits of decreased ASCVD risk and weight loss. Will start rybelsus 3 mg daily for 1 month followed by 7 mg daily. Rx sent for both of these, and patient was instructed on how to take them. Follow-up in 3 months with repeat A1c.

## 2021-08-09 NOTE — Patient Instructions (Signed)
Thank you, [name] for allowing [place] to provide your care today. Today we discussed your diabetes, blood pressure, and need for more bloodwork.  Your blood pressure looks great today! No adjustments in your medications today.  For your diabetes, you will no longer take glimepiride and will be prescribed Rybelsus.  Please take this medication about 30 minutes prior to your first meal.  I am sending both of these prescriptions to your pharmacy. You will take 3 mg for the first 30 days, followed by 7 mg daily until your next appointment.  I am checking labs today and will call you if these results are abnormal.   I have ordered the following labs for you:  Lab Orders         Glucose, capillary         BMP8+Anion Gap         Lipid Profile         POC Hbg A1C        Referrals ordered today:   Referral Orders  No referral(s) requested today     I have ordered the following medication/changed the following medications:     Start the following medications: Meds ordered this encounter  Medications   sildenafil (REVATIO) 20 MG tablet    Sig: TAKE 5 TABLETS BY MOUTH AS NEEDED    Dispense:  60 tablet    Refill:  1   DISCONTD: Semaglutide (RYBELSUS) 3 MG TABS    Sig: Take 3 mg by mouth daily.    Dispense:  30 tablet    Refill:  3   Semaglutide (RYBELSUS) 3 MG TABS    Sig: Take 3 mg by mouth daily.    Dispense:  30 tablet    Refill:  0   Semaglutide (RYBELSUS) 7 MG TABS    Sig: Take 7 mg by mouth daily.    Dispense:  60 tablet    Refill:  2     Follow up: 3 months    Should you have any questions or concerns please call the internal medicine clinic at [phone].

## 2021-08-09 NOTE — Assessment & Plan Note (Signed)
Continue atorvastatin 40 mg daily. Lipid panel today.

## 2021-08-09 NOTE — Assessment & Plan Note (Addendum)
BP Readings from Last 3 Encounters:  08/09/21 138/73  02/02/19 129/71  09/18/18 136/69   The patient is here today to re-establish with [place] though is technically an established patient here given less than 3 years ago since he was last seen. He was previously followed by [facility]. He has been on amlodipine and benicar and has been compliant with these regimens at home.  Plan: Continue current regimen. Will obtain BMP today.  ADDENDUM: K of 3.3, Mag of 1.8. Pt called and informed of these results. Will start spironolactone 25 mg daily and follow-up in 1 month for a BP check and check BMP at that time. Pt is in agreement with this plan.

## 2021-08-09 NOTE — Progress Notes (Signed)
   CC: diabetes and HTN follow-up  HPI:  [name] is a 61 y.o. with past medical history as noted below who presents to the clinic today for follow-up. Please see problem-based list for further details, assessments, and plans.   Past Medical History:  Diagnosis Date   Diabetes mellitus without complication ([place])    Hyperlipidemia    Hypertension    Need for diphtheria-tetanus-pertussis (Tdap) vaccine 02/11/2018   Review of Systems: Negative aside from that listed in individualized problem based charting.   Physical Exam:  Vitals:   08/09/21 1520  BP: 138/73  Pulse: 94  Temp: 97.9 F (36.6 C)  TempSrc: Oral  SpO2: 98%  Weight: 171 lb 4.8 oz (77.7 kg)  Height: '5\' 6"'$  (1.676 m)   General: NAD, nl appearance HE: Normocephalic, atraumatic, EOMI, Conjunctivae normal ENT: No congestion, no rhinorrhea, no exudate or erythema  Cardiovascular: Normal rate, regular rhythm. No murmurs, rubs, or gallops Pulmonary: Effort normal, breath sounds normal. No wheezes, rales, or rhonchi Abdominal: soft, nontender, bowel sounds present Musculoskeletal: no swelling, deformity, injury or tenderness in extremities Skin: Warm, dry, no bruising, erythema, or rash Psychiatric/Behavioral: normal mood, normal behavior      Assessment & Plan:   See Encounters Tab for problem based charting.  Patient discussed with Dr. Heber [place]

## 2021-08-10 LAB — LIPID PANEL
Chol/HDL Ratio: 3.9 ratio (ref 0.0–5.0)
Cholesterol, Total: 140 mg/dL (ref 100–199)
HDL: 36 mg/dL — ABNORMAL LOW (ref >39)
LDL Chol Calc (NIH): 86 mg/dL (ref 0–99)
Triglycerides: 94 mg/dL (ref 0–149)
VLDL Cholesterol Cal: 18 mg/dL (ref 5–40)

## 2021-08-10 LAB — BMP8+ANION GAP
Anion Gap: 16 mmol/L (ref 10.0–18.0)
BUN/Creatinine Ratio: 11 (ref 10–24)
BUN: 10 mg/dL (ref 8–27)
CO2: 28 mmol/L (ref 20–29)
Calcium: 9.7 mg/dL (ref 8.6–10.2)
Chloride: 98 mmol/L (ref 96–106)
Creatinine, Ser: 0.87 mg/dL (ref 0.76–1.27)
Glucose: 128 mg/dL — ABNORMAL HIGH (ref 70–99)
Potassium: 3.3 mmol/L — ABNORMAL LOW (ref 3.5–5.2)
Sodium: 142 mmol/L (ref 134–144)
eGFR: 99 mL/min/{1.73_m2} (ref >59)

## 2021-08-10 NOTE — Addendum Note (Signed)
Addended by: Orvis Brill on: 08/10/2021 01:38 PM   Modules accepted: Orders

## 2021-08-11 ENCOUNTER — Telehealth: Payer: Self-pay | Admitting: *Deleted

## 2021-08-11 DIAGNOSIS — E113299 Type 2 diabetes mellitus with mild nonproliferative diabetic retinopathy without macular edema, unspecified eye: Secondary | ICD-10-CM

## 2021-08-11 MED ORDER — RYBELSUS 3 MG PO TABS: 3.0000 mg | ORAL_TABLET | Freq: Every day | ORAL | 0 refills | Status: DC

## 2021-08-11 MED ORDER — RYBELSUS 7 MG PO TABS: 7.0000 mg | ORAL_TABLET | Freq: Every day | ORAL | 2 refills | Status: AC

## 2021-08-11 NOTE — Progress Notes (Signed)
Internal Medicine Clinic Attending  Case discussed with the resident at the time of the visit.  We reviewed the resident's history and exam and pertinent patient test results.  I agree with the assessment, diagnosis, and plan of care documented in the resident's note.  

## 2021-08-11 NOTE — Addendum Note (Signed)
Addended by: Orvis Brill on: 08/11/2021 01:39 PM   Modules accepted: Orders

## 2021-08-11 NOTE — Telephone Encounter (Signed)
Patient called in stating [facility] is [facility] at [facility]. This was sent to [facility]. Patient states only med he gets at [facility] is sildenafil. Requesting new meds be [facility]

## 2021-08-14 MED ORDER — SPIRONOLACTONE 25 MG PO TABS: 25.0000 mg | ORAL_TABLET | Freq: Every day | ORAL | 0 refills | Status: DC

## 2021-08-14 NOTE — Addendum Note (Signed)
Addended by: Orvis Brill on: 08/14/2021 01:54 PM   Modules accepted: Orders

## 2021-08-21 LAB — SPECIMEN STATUS REPORT

## 2021-08-21 LAB — MAGNESIUM: Magnesium: 1.8 mg/dL (ref 1.6–2.3)

## 2021-09-25 ENCOUNTER — Ambulatory Visit (INDEPENDENT_AMBULATORY_CARE_PROVIDER_SITE_OTHER): Payer: 59 | Admitting: Internal Medicine

## 2021-09-25 ENCOUNTER — Other Ambulatory Visit: Payer: Self-pay

## 2021-09-25 ENCOUNTER — Encounter: Payer: Self-pay | Admitting: Internal Medicine

## 2021-09-25 VITALS — BP 123/68 | HR 79 | Temp 97.9°F | Ht 66.0 in | Wt 166.7 lb

## 2021-09-25 DIAGNOSIS — I10 Essential (primary) hypertension: Secondary | ICD-10-CM

## 2021-09-25 DIAGNOSIS — Z7984 Long term (current) use of oral hypoglycemic drugs: Secondary | ICD-10-CM

## 2021-09-25 DIAGNOSIS — Z0000 Encounter for general adult medical examination without abnormal findings: Secondary | ICD-10-CM

## 2021-09-25 DIAGNOSIS — E113299 Type 2 diabetes mellitus with mild nonproliferative diabetic retinopathy without macular edema, unspecified eye: Secondary | ICD-10-CM | POA: Diagnosis not present

## 2021-09-25 NOTE — Progress Notes (Signed)
Subjective:   Patient ID: Ross Warner male   DOB: 1960-10-07 61 y.o.   MRN: [id number]  HPI: [name] is a 61 y.o. with a pmhx significant for HTN, T2DM who presents for follow-up of chronic conditions. Please see Plan for individualized problem-based charting.   Patient Active Problem List   Diagnosis Date Noted   Colonic polyp 04/29/2013   Erectile dysfunction 04/14/2012   Health care maintenance 04/08/2012   Diabetic Retinopathy, mild 03/21/2009   HYPERCHOLESTEROLEMIA 01/09/2007   Essential hypertension 01/08/2007   Type 2 diabetes, controlled 01/07/2002     Current Outpatient Medications  Medication Sig Dispense Refill   amLODipine (NORVASC) 10 MG tablet Take 1 tablet (10 mg total) by mouth daily. 90 tablet 3   atorvastatin (LIPITOR) 40 MG tablet Take 1 tablet (40 mg total) by mouth daily. 90 tablet 3   cholecalciferol (VITAMIN D) 1000 units tablet Take 1,000 Units by mouth as needed.     glucose blood (AGAMATRIX PRESTO TEST) test strip Use as instructed 100 each 12   Lancets MISC 1 each by Does not apply route 3 (three) times daily. 100 each 11   metFORMIN (GLUCOPHAGE) 1000 MG tablet Take 1,000 mg by mouth 2 (two) times daily with a meal.     olmesartan-hydrochlorothiazide (BENICAR HCT) 40-25 MG tablet Take 1 tablet by mouth daily.     omega-3 acid ethyl esters (LOVAZA) 1 G capsule Take 2 capsules (2 g total) by mouth 2 (two) times daily. 360 capsule 4   potassium chloride SA (KLOR-CON M) 20 MEQ tablet Take 20 mEq by mouth daily.     Semaglutide (RYBELSUS) 7 MG TABS Take 7 mg by mouth daily. 60 tablet 2   sildenafil (REVATIO) 20 MG tablet TAKE 5 TABLETS BY MOUTH AS NEEDED 60 tablet 1   spironolactone (ALDACTONE) 25 MG tablet Take 1 tablet (25 mg total) by mouth daily. 90 tablet 0   vitamin B-12 (CYANOCOBALAMIN) 100 MCG tablet Take 2,500 mcg by mouth as needed.     Current Facility-Administered Medications  Medication Dose Route Frequency Provider Last  Rate Last Admin   0.9 %  sodium chloride infusion  500 mL Intravenous Continuous Danis, Estill Cotta III, MD         Review of Systems:   Review of systems is negative other than what is noted in individual problem-based charting.   Objective:   Physical Exam: Vitals:   09/25/21 1317  BP: 123/68  Pulse: 79  Temp: 97.9 F (36.6 C)  TempSrc: Oral  SpO2: 100%  Weight: 166 lb 11.2 oz (75.6 kg)  Height: '5\' 6"'$  (1.676 m)   Physical Exam Constitutional:      General: He is not in acute distress.    Appearance: Normal appearance.  Cardiovascular:     Rate and Rhythm: Normal rate and regular rhythm.     Heart sounds: No murmur heard.    No friction rub. No gallop.  Pulmonary:     Effort: Pulmonary effort is normal. No respiratory distress.     Breath sounds: Normal breath sounds. No wheezing or rales.  Abdominal:     General: Abdomen is flat. There is no distension.     Palpations: Abdomen is soft.     Tenderness: There is no abdominal tenderness.  Skin:    General: Skin is warm and dry.  Neurological:     Mental Status: He is alert and oriented to person, place, and time.  Assessment & Plan:   Essential hypertension Patient has chronic stable HTN managed with amlodipine, Benicar, and Spironolactone. Patient reports feeling well on spironolactone and reports no side effects. Denies galactorrhea. BP today was 123/68. Patient is at goal BP, will plan to continue on current regimen with no changes.   Plan: -BMP today -Urine microalbumin/cr -Continue Amlodipine '10mg'$ , Benicar 40-'25mg'$ , Spironolactone '25mg'$   Type 2 diabetes, controlled Patient has chronic type 2 diabetes managed with Metformin and Rybelsus. Patient rarely checks blood sugar at home. Patient reports feeling well on Rybelsus and denies nausea/vomiting/bowel movement changes. Patient had eye exam done 7 months ago with [facility]. Last month's a1C was 8. Patient's weight is down 5lb since last visit one month ago.  Given patient is tolerating Rybelsus well, will plan to continue current dose at '7mg'$  and reevaluate if we need to increase after next a1C in two months.   Plan: -Foot exam today -Continue Rybelsus '7mg'$  -Continue Metformin '1000mg'$  BID    Health care maintenance Patient is overdue for colonoscopy. His last colonoscopy was May/2018 and was recommended for repeat screening at 5 years for sessile polyps. Patient expressed hesitancy for repeat colonoscopy. We discussed the importance of timely colon cancer screening for early detection and encouraged patient to have it completed this year, patient expressed understanding and said he plans to make an appointment for this year or early next year. Will continue to follow at next visit.   Patient had documented eye exam with [facility].

## 2021-09-25 NOTE — Assessment & Plan Note (Addendum)
Patient has chronic stable HTN managed with amlodipine, Benicar, and Spironolactone. Patient reports feeling well on spironolactone and reports no side effects. Denies galactorrhea. BP today was 123/68. Patient is at goal BP, will plan to continue on current regimen with no changes.   Plan: -BMP today -Urine microalbumin/cr -Continue Amlodipine '10mg'$ , Benicar 40-'25mg'$ , Spironolactone '25mg'$ 

## 2021-09-25 NOTE — Patient Instructions (Addendum)
Thank you for seeing [place] today!  Labwork We will call you with your results.   2. Medications We did not make any changes to your medications today. Please call us if you are having any trouble filling your prescriptions.   3. Colonoscopy  You are due for a colonoscopy and we recommend that you schedule one for this year.

## 2021-09-25 NOTE — Assessment & Plan Note (Signed)
Patient has chronic type 2 diabetes managed with Metformin and Rybelsus. Patient rarely checks blood sugar at home. Patient reports feeling well on Rybelsus and denies nausea/vomiting/bowel movement changes. Patient had eye exam done 7 months ago with [facility]. Last month's a1C was 8. Patient's weight is down 5lb since last visit one month ago. Given patient is tolerating Rybelsus well, will plan to continue current dose at '7mg'$  and reevaluate if we need to increase after next a1C in two months.   Plan: -Foot exam today -Continue Rybelsus '7mg'$  -Continue Metformin '1000mg'$  BID

## 2021-09-25 NOTE — Assessment & Plan Note (Signed)
Patient is overdue for colonoscopy. His last colonoscopy was May/2018 and was recommended for repeat screening at 5 years for sessile polyps. Patient expressed hesitancy for repeat colonoscopy. We discussed the importance of timely colon cancer screening for early detection and encouraged patient to have it completed this year, patient expressed understanding and said he plans to make an appointment for this year or early next year. Will continue to follow at next visit.   Patient had documented eye exam with [facility].

## 2021-09-26 LAB — BMP8+ANION GAP
Anion Gap: 18 mmol/L (ref 10.0–18.0)
BUN/Creatinine Ratio: 10 (ref 10–24)
BUN: 10 mg/dL (ref 8–27)
CO2: 22 mmol/L (ref 20–29)
Calcium: 9.8 mg/dL (ref 8.6–10.2)
Chloride: 96 mmol/L (ref 96–106)
Creatinine, Ser: 0.96 mg/dL (ref 0.76–1.27)
Glucose: 92 mg/dL (ref 70–99)
Potassium: 4.5 mmol/L (ref 3.5–5.2)
Sodium: 136 mmol/L (ref 134–144)
eGFR: 90 mL/min/{1.73_m2} (ref >59)

## 2021-10-02 NOTE — Addendum Note (Signed)
Addended by: Buddy Duty on: 10/02/2021 02:45 PM   Modules accepted: Orders

## 2021-10-15 NOTE — Progress Notes (Signed)
Internal Medicine Clinic Attending  Case discussed with Dr. Raymondo Band and MS3 Meda Coffee at the time of the visit.  We reviewed the  history and exam and pertinent patient test results.  I agree with the assessment, diagnosis, and plan of care documented in the note.

## 2021-11-08 ENCOUNTER — Other Ambulatory Visit: Payer: Self-pay | Admitting: Internal Medicine

## 2021-11-08 DIAGNOSIS — I10 Essential (primary) hypertension: Secondary | ICD-10-CM

## 2021-11-08 MED ORDER — SPIRONOLACTONE 25 MG PO TABS: 25.0000 mg | ORAL_TABLET | Freq: Every day | ORAL | 1 refills | Status: DC

## 2021-11-08 NOTE — Telephone Encounter (Signed)
Pt states he went to the pharmacy on Saturday, Monday and is in the pharmacy right and he has not gottten his medication refilled yet.  The pt states he is going to stay in the store until he is able to get his refill.   spironolactone (ALDACTONE) 25 MG tablet  [id number] [place], [place]

## 2021-12-05 ENCOUNTER — Encounter: Payer: Self-pay | Admitting: Dietician

## 2021-12-26 ENCOUNTER — Encounter: Payer: Self-pay | Admitting: Gastroenterology

## 2022-01-16 ENCOUNTER — Encounter: Payer: Self-pay | Admitting: Internal Medicine

## 2022-01-17 ENCOUNTER — Encounter: Payer: Self-pay | Admitting: Internal Medicine

## 2022-01-25 ENCOUNTER — Other Ambulatory Visit: Payer: Self-pay

## 2022-01-25 DIAGNOSIS — I10 Essential (primary) hypertension: Secondary | ICD-10-CM

## 2022-01-25 DIAGNOSIS — E113299 Type 2 diabetes mellitus with mild nonproliferative diabetic retinopathy without macular edema, unspecified eye: Secondary | ICD-10-CM

## 2022-01-25 DIAGNOSIS — E7800 Pure hypercholesterolemia, unspecified: Secondary | ICD-10-CM

## 2022-01-25 DIAGNOSIS — E876 Hypokalemia: Secondary | ICD-10-CM

## 2022-01-25 MED ORDER — SPIRONOLACTONE 25 MG PO TABS: 25.0000 mg | ORAL_TABLET | Freq: Every day | ORAL | 3 refills | Status: DC

## 2022-01-25 MED ORDER — METFORMIN HCL 1000 MG PO TABS: 1000.0000 mg | ORAL_TABLET | Freq: Two times a day (BID) | ORAL | 3 refills | Status: DC

## 2022-01-25 MED ORDER — OLMESARTAN MEDOXOMIL-HCTZ 40-25 MG PO TABS: 1.0000 | ORAL_TABLET | Freq: Every day | ORAL | 3 refills | Status: DC

## 2022-01-25 MED ORDER — AMLODIPINE BESYLATE 10 MG PO TABS: 10.0000 mg | ORAL_TABLET | Freq: Every day | ORAL | 3 refills | Status: DC

## 2022-01-25 MED ORDER — ATORVASTATIN CALCIUM 40 MG PO TABS: 40.0000 mg | ORAL_TABLET | Freq: Every day | ORAL | 3 refills | Status: DC

## 2022-01-25 MED ORDER — POTASSIUM CHLORIDE CRYS ER 20 MEQ PO TBCR: 20.0000 meq | EXTENDED_RELEASE_TABLET | Freq: Every day | ORAL | 1 refills | Status: DC

## 2022-02-01 ENCOUNTER — Ambulatory Visit (INDEPENDENT_AMBULATORY_CARE_PROVIDER_SITE_OTHER): Payer: Commercial Managed Care - HMO | Admitting: Internal Medicine

## 2022-02-01 ENCOUNTER — Other Ambulatory Visit: Payer: Self-pay

## 2022-02-01 ENCOUNTER — Encounter: Payer: Self-pay | Admitting: Internal Medicine

## 2022-02-01 VITALS — BP 130/63 | HR 96 | Temp 97.7°F | Ht 66.0 in | Wt 165.0 lb

## 2022-02-01 DIAGNOSIS — Z0000 Encounter for general adult medical examination without abnormal findings: Secondary | ICD-10-CM

## 2022-02-01 DIAGNOSIS — I10 Essential (primary) hypertension: Secondary | ICD-10-CM

## 2022-02-01 DIAGNOSIS — Z7984 Long term (current) use of oral hypoglycemic drugs: Secondary | ICD-10-CM

## 2022-02-01 DIAGNOSIS — E113299 Type 2 diabetes mellitus with mild nonproliferative diabetic retinopathy without macular edema, unspecified eye: Secondary | ICD-10-CM

## 2022-02-01 DIAGNOSIS — E7800 Pure hypercholesterolemia, unspecified: Secondary | ICD-10-CM

## 2022-02-01 LAB — POCT GLYCOSYLATED HEMOGLOBIN (HGB A1C): Hemoglobin A1C: 7.1 % — AB (ref 4.0–5.6)

## 2022-02-01 LAB — GLUCOSE, CAPILLARY: Glucose-Capillary: 166 mg/dL — ABNORMAL HIGH (ref 70–99)

## 2022-02-01 MED ORDER — SPIRONOLACTONE 25 MG PO TABS: 25.0000 mg | ORAL_TABLET | Freq: Every day | ORAL | 3 refills | Status: AC

## 2022-02-01 MED ORDER — ATORVASTATIN CALCIUM 40 MG PO TABS: 40.0000 mg | ORAL_TABLET | Freq: Every day | ORAL | 3 refills | Status: AC

## 2022-02-01 MED ORDER — OLMESARTAN MEDOXOMIL-HCTZ 40-25 MG PO TABS: 1.0000 | ORAL_TABLET | Freq: Every day | ORAL | 3 refills | Status: AC

## 2022-02-01 MED ORDER — METFORMIN HCL 1000 MG PO TABS: 1000.0000 mg | ORAL_TABLET | Freq: Two times a day (BID) | ORAL | 3 refills | Status: AC

## 2022-02-01 MED ORDER — AMLODIPINE BESYLATE 10 MG PO TABS: 10.0000 mg | ORAL_TABLET | Freq: Every day | ORAL | 3 refills | Status: AC

## 2022-02-01 NOTE — Progress Notes (Signed)
CC: diabetes f/u  HPI:  [name] is a 61 y.o. male living with a history stated below and presents today for a 3 month follow up of his diabetes. Please see problem based assessment and plan for additional details.  Past Medical History:  Diagnosis Date   Diabetes mellitus without complication ([place])    Hyperlipidemia    Hypertension    Need for diphtheria-tetanus-pertussis (Tdap) vaccine 02/11/2018    Current Outpatient Medications on File Prior to Visit  Medication Sig Dispense Refill   cholecalciferol (VITAMIN D) 1000 units tablet Take 1,000 Units by mouth as needed.     glucose blood (AGAMATRIX PRESTO TEST) test strip Use as instructed 100 each 12   Lancets MISC 1 each by Does not apply route 3 (three) times daily. 100 each 11   omega-3 acid ethyl esters (LOVAZA) 1 G capsule Take 2 capsules (2 g total) by mouth 2 (two) times daily. 360 capsule 4   Semaglutide (RYBELSUS) 7 MG TABS Take 7 mg by mouth daily. 60 tablet 2   sildenafil (REVATIO) 20 MG tablet TAKE 5 TABLETS BY MOUTH AS NEEDED 60 tablet 1   vitamin B-12 (CYANOCOBALAMIN) 100 MCG tablet Take 2,500 mcg by mouth as needed.     No current facility-administered medications on file prior to visit.    Family History  Problem Relation Age of Onset   Hypertension Mother    Diabetes Mother    Colon cancer Neg Hx     Social History   Socioeconomic History   Marital status: Married    Spouse name: Not on file   Number of children: Not on file   Years of education: Not on file   Highest education level: Not on file  Occupational History   Occupation: Taxi Geophysicist/field seismologist    Comment: Moved from [place] 15 years ago   Tobacco Use   Smoking status: Never   Smokeless tobacco: Never  Substance and Sexual Activity   Alcohol use: No    Alcohol/week: 0.0 standard drinks of alcohol   Drug use: No   Sexual activity: Not on file  Other Topics Concern   Not on file  Social History Narrative   Immigrated from [place] to  the [place] in 2000   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    Education: Secretary/administrator level          Social Determinants of Health   Financial Resource Strain: Not on file  Food Insecurity: No Food Insecurity (02/01/2022)   Hunger Vital Sign    Worried About Running Out of Food in the Last Year: Never true    Ran Out of Food in the Last Year: Never true  Transportation Needs: No Transportation Needs (02/01/2022)   PRAPARE - Hydrologist (Medical): No    Lack of Transportation (Non-Medical): No  Physical Activity: Not on file  Stress: Not on file  Social Connections: Moderately Integrated (02/01/2022)   Social Connection and Isolation Panel [NHANES]    Frequency of Communication with Friends and Family: More than three times a week    Frequency of Social Gatherings with Friends and Family: More than three times a week    Attends Religious Services: More than 4 times per year    Active Member of [employer] or Organizations: No    Attends Archivist Meetings: Never    Marital Status: Married  Human resources officer Violence: Not At Risk (02/01/2022)  Humiliation, Afraid, Rape, and Kick questionnaire    Fear of Current or Ex-Partner: No    Emotionally Abused: No    Physically Abused: No    Sexually Abused: No    Review of Systems: ROS negative except for what is noted on the assessment and plan.  Vitals:   02/01/22 0859  BP: 130/63  Pulse: 96  Temp: 97.7 F (36.5 C)  TempSrc: Oral  SpO2: 100%  Weight: 165 lb (74.8 kg)  Height: '5\' 6"'$  (1.676 m)    Physical Exam: Constitutional: well-appearing male sitting in chair, in no acute distress Cardiovascular: regular rate and rhythm, no m/r/g Pulmonary/Chest: normal work of breathing on room air, lungs clear to auscultation bilaterally MSK: normal bulk and tone Neurological: alert & oriented x 3, no focal deficit Skin: warm and dry Psych: normal mood and behavior  Assessment &  Plan:   Patient discussed with Dr. Jimmye Norman  Essential hypertension Blood pressure is well-controlled today at 130/63.  The patient is on amlodipine 10 mg daily, Benicar 40-25 mg daily, and spironolactone 25 mg daily.  He denies any headache, dizziness, chest pain, or dyspnea.  No changes to medication regimen today.  Type 2 diabetes, controlled A1c is improved from 8% to 7.1% today.  The patient has been off of his Rybelsus for about 2 months, as he thought this was discontinued.  He has been taking metformin 1000 mg twice daily, and has been doing well with this.  He denies any nausea, vomiting, or bowel movement changes.  The patient has been more conscious about his diet and has limited the amount of carbs he is eating and has been exercising.  Plan: -Continue metformin 1 g twice daily -Urine micro today  Health care maintenance - Last colonoscopy was in 2018 and was recommended to have a repeat in 5 years due to sessile polyps.  The patient was provided with the phone number for [facility] and will call to schedule this, as long as his insurance covers it. -Declined flu vaccine   Ross Warner, D.O. [facility], PGY-2 Phone: [phone] Date 02/01/2022 Time 9:18 AM

## 2022-02-01 NOTE — Assessment & Plan Note (Addendum)
-   Last colonoscopy was in 2018 and was recommended to have a repeat in 5 years due to sessile polyps.  The patient was provided with the phone number for [facility] and will call to schedule this, as long as his insurance covers it. -Declined flu vaccine

## 2022-02-01 NOTE — Assessment & Plan Note (Signed)
Blood pressure is well-controlled today at 130/63.  The patient is on amlodipine 10 mg daily, Benicar 40-25 mg daily, and spironolactone 25 mg daily.  He denies any headache, dizziness, chest pain, or dyspnea.  No changes to medication regimen today.

## 2022-02-01 NOTE — Assessment & Plan Note (Signed)
A1c is improved from 8% to 7.1% today.  The patient has been off of his Rybelsus for about 2 months, as he thought this was discontinued.  He has been taking metformin 1000 mg twice daily, and has been doing well with this.  He denies any nausea, vomiting, or bowel movement changes.  The patient has been more conscious about his diet and has limited the amount of carbs he is eating and has been exercising.  Plan: -Continue metformin 1 g twice daily -Urine micro today

## 2022-02-01 NOTE — Patient Instructions (Signed)
Thank you, Ross Warner for allowing [place] to provide your care today. Today we discussed:  Diabetes: Keep taking metformin twice a day Your A1c is a lot better today, it is 7.1%!  Blood pressure: Keep taking amlodipine, benicar, and spironolactone once a day!  Cholesterol: Keep taking atorvastatin  Please call to schedule a colonoscopy: [phone]  I have ordered the following labs for you:   Lab Orders         Glucose, capillary         Microalbumin / Creatinine Urine Ratio         POC Hbg A1C       Referrals ordered today:   Referral Orders  No referral(s) requested today     I have ordered the following medication/changed the following medications:   Stop the following medications: Medications Discontinued During This Encounter  Medication Reason   potassium chloride SA (KLOR-CON M) 20 MEQ tablet    spironolactone (ALDACTONE) 25 MG tablet Reorder   metFORMIN (GLUCOPHAGE) 1000 MG tablet Reorder   amLODipine (NORVASC) 10 MG tablet Reorder   atorvastatin (LIPITOR) 40 MG tablet Reorder   olmesartan-hydrochlorothiazide (BENICAR HCT) 40-25 MG tablet Reorder     Start the following medications: Meds ordered this encounter  Medications   olmesartan-hydrochlorothiazide (BENICAR HCT) 40-25 MG tablet    Sig: Take 1 tablet by mouth daily.    Dispense:  90 tablet    Refill:  3   spironolactone (ALDACTONE) 25 MG tablet    Sig: Take 1 tablet (25 mg total) by mouth daily.    Dispense:  90 tablet    Refill:  3   amLODipine (NORVASC) 10 MG tablet    Sig: Take 1 tablet (10 mg total) by mouth daily.    Dispense:  90 tablet    Refill:  3   metFORMIN (GLUCOPHAGE) 1000 MG tablet    Sig: Take 1 tablet (1,000 mg total) by mouth 2 (two) times daily with a meal.    Dispense:  180 tablet    Refill:  3   atorvastatin (LIPITOR) 40 MG tablet    Sig: Take 1 tablet (40 mg total) by mouth daily.    Dispense:  90 tablet    Refill:  3     Follow up: 6 months    Should you  have any questions or concerns please call the internal medicine clinic at [phone].     Buddy Duty, D.O. [place]

## 2022-02-02 LAB — MICROALBUMIN / CREATININE URINE RATIO
Creatinine, Urine: 21 mg/dL
Microalb/Creat Ratio: 18 mg/g creat (ref 0–29)
Microalbumin, Urine: 3.8 ug/mL

## 2022-02-05 NOTE — Progress Notes (Signed)
Urine micro within normal limits.

## 2022-02-06 NOTE — Progress Notes (Signed)
Internal Medicine Clinic Attending ° °Case discussed with Dr. Atway  At the time of the visit.  We reviewed the resident’s history and exam and pertinent patient test results.  I agree with the assessment, diagnosis, and plan of care documented in the resident’s note.  °

## 2023-06-28 ENCOUNTER — Other Ambulatory Visit: Payer: Self-pay | Admitting: Internal Medicine

## 2023-06-28 DIAGNOSIS — I10 Essential (primary) hypertension: Secondary | ICD-10-CM

## 2023-07-24 ENCOUNTER — Other Ambulatory Visit: Payer: Self-pay | Admitting: Internal Medicine

## 2023-07-24 DIAGNOSIS — E7800 Pure hypercholesterolemia, unspecified: Secondary | ICD-10-CM

## 2023-07-25 NOTE — Telephone Encounter (Signed)
 NO LONGER [facility] PATIENT AS OF TODAY
